# Patient Record
Sex: Female | Born: 1940 | Race: White | Hispanic: No | Smoking: Never smoker
Health system: Southern US, Community
[De-identification: ages and names within clinical notes are randomized; demographics above are authoritative.]

## PROBLEM LIST (undated history)

## (undated) DIAGNOSIS — R0789 Other chest pain: Secondary | ICD-10-CM

## (undated) DIAGNOSIS — Z6831 Body mass index (BMI) 31.0-31.9, adult: Secondary | ICD-10-CM

## (undated) DIAGNOSIS — E785 Hyperlipidemia, unspecified: Secondary | ICD-10-CM

## (undated) DIAGNOSIS — R0602 Shortness of breath: Secondary | ICD-10-CM

## (undated) DIAGNOSIS — C9110 Chronic lymphocytic leukemia of B-cell type not having achieved remission: Secondary | ICD-10-CM

## (undated) DIAGNOSIS — E039 Hypothyroidism, unspecified: Secondary | ICD-10-CM

## (undated) DIAGNOSIS — H9209 Otalgia, unspecified ear: Secondary | ICD-10-CM

## (undated) DIAGNOSIS — I10 Essential (primary) hypertension: Secondary | ICD-10-CM

## (undated) HISTORY — DX: Body mass index (BMI) 31.0-31.9, adult: Z68.31

## (undated) HISTORY — DX: Hyperlipidemia, unspecified: E78.5

## (undated) HISTORY — DX: Other chest pain: R07.89

## (undated) HISTORY — DX: Shortness of breath: R06.02

## (undated) HISTORY — DX: Chronic lymphocytic leukemia of B-cell type not having achieved remission: C91.10

## (undated) HISTORY — DX: Otalgia, unspecified ear: H92.09

## (undated) HISTORY — DX: Hypothyroidism, unspecified: E03.9

## (undated) HISTORY — DX: Essential (primary) hypertension: I10

---

## 2009-06-03 ENCOUNTER — Emergency Department (HOSPITAL_COMMUNITY): Admission: EM | Admit: 2009-06-03 | Discharge: 2009-06-03 | Payer: Self-pay | Admitting: Family Medicine

## 2009-07-26 ENCOUNTER — Emergency Department (HOSPITAL_COMMUNITY): Admission: EM | Admit: 2009-07-26 | Discharge: 2009-07-26 | Payer: Self-pay | Admitting: Family Medicine

## 2009-11-08 ENCOUNTER — Encounter: Payer: Self-pay | Admitting: Obstetrics and Gynecology

## 2009-11-08 ENCOUNTER — Ambulatory Visit (HOSPITAL_COMMUNITY): Admission: RE | Admit: 2009-11-08 | Discharge: 2009-11-09 | Payer: Self-pay | Admitting: Obstetrics and Gynecology

## 2010-10-02 ENCOUNTER — Encounter: Payer: Self-pay | Admitting: Family Medicine

## 2010-12-01 LAB — COMPREHENSIVE METABOLIC PANEL
ALT: 17 U/L (ref 0–35)
AST: 18 U/L (ref 0–37)
Albumin: 3.8 g/dL (ref 3.5–5.2)
Alkaline Phosphatase: 61 U/L (ref 39–117)
BUN: 7 mg/dL (ref 6–23)
CO2: 28 mEq/L (ref 19–32)
Calcium: 9.1 mg/dL (ref 8.4–10.5)
Chloride: 105 mEq/L (ref 96–112)
Creatinine, Ser: 0.64 mg/dL (ref 0.4–1.2)
GFR calc Af Amer: >60 mL/min (ref >60)
GFR calc non Af Amer: >60 mL/min (ref >60)
Glucose, Bld: 102 mg/dL — ABNORMAL HIGH (ref 70–99)
Potassium: 3.8 mEq/L (ref 3.5–5.1)
Sodium: 138 mEq/L (ref 135–145)
Total Bilirubin: 0.4 mg/dL (ref 0.3–1.2)
Total Protein: 6.7 g/dL (ref 6.0–8.3)

## 2010-12-01 LAB — CBC
HCT: 39 % (ref 36.0–46.0)
Platelets: 269 10*3/uL (ref 150–400)
RBC: 4.46 MIL/uL (ref 3.87–5.11)

## 2010-12-05 LAB — CBC
RBC: 3.46 MIL/uL — ABNORMAL LOW (ref 3.87–5.11)
WBC: 10.1 10*3/uL (ref 4.0–10.5)

## 2010-12-14 LAB — POCT I-STAT, CHEM 8
Hemoglobin: 13.6 g/dL (ref 12.0–15.0)
Sodium: 140 mEq/L (ref 135–145)
TCO2: 26 mmol/L (ref 0–100)

## 2010-12-14 LAB — CBC
Hemoglobin: 13.3 g/dL (ref 12.0–15.0)
Platelets: 259 10*3/uL (ref 150–400)
RDW: 13.5 % (ref 11.5–15.5)

## 2010-12-14 LAB — DIFFERENTIAL
Basophils Absolute: 0.1 10*3/uL (ref 0.0–0.1)
Eosinophils Absolute: 0.2 10*3/uL (ref 0.0–0.7)
Eosinophils Relative: 2 % (ref 0–5)

## 2011-05-01 IMAGING — CR DG SINUSES 1-2V
2 series · 2 of 2 positions shown · non-contrast
Comparison: None

CLINICAL DATA: Recurrent/chronic infections of the sinuses and
[DATE].

PARANASAL SINUSES - 1-2 VIEW

[view not recorded (1 of 2)]
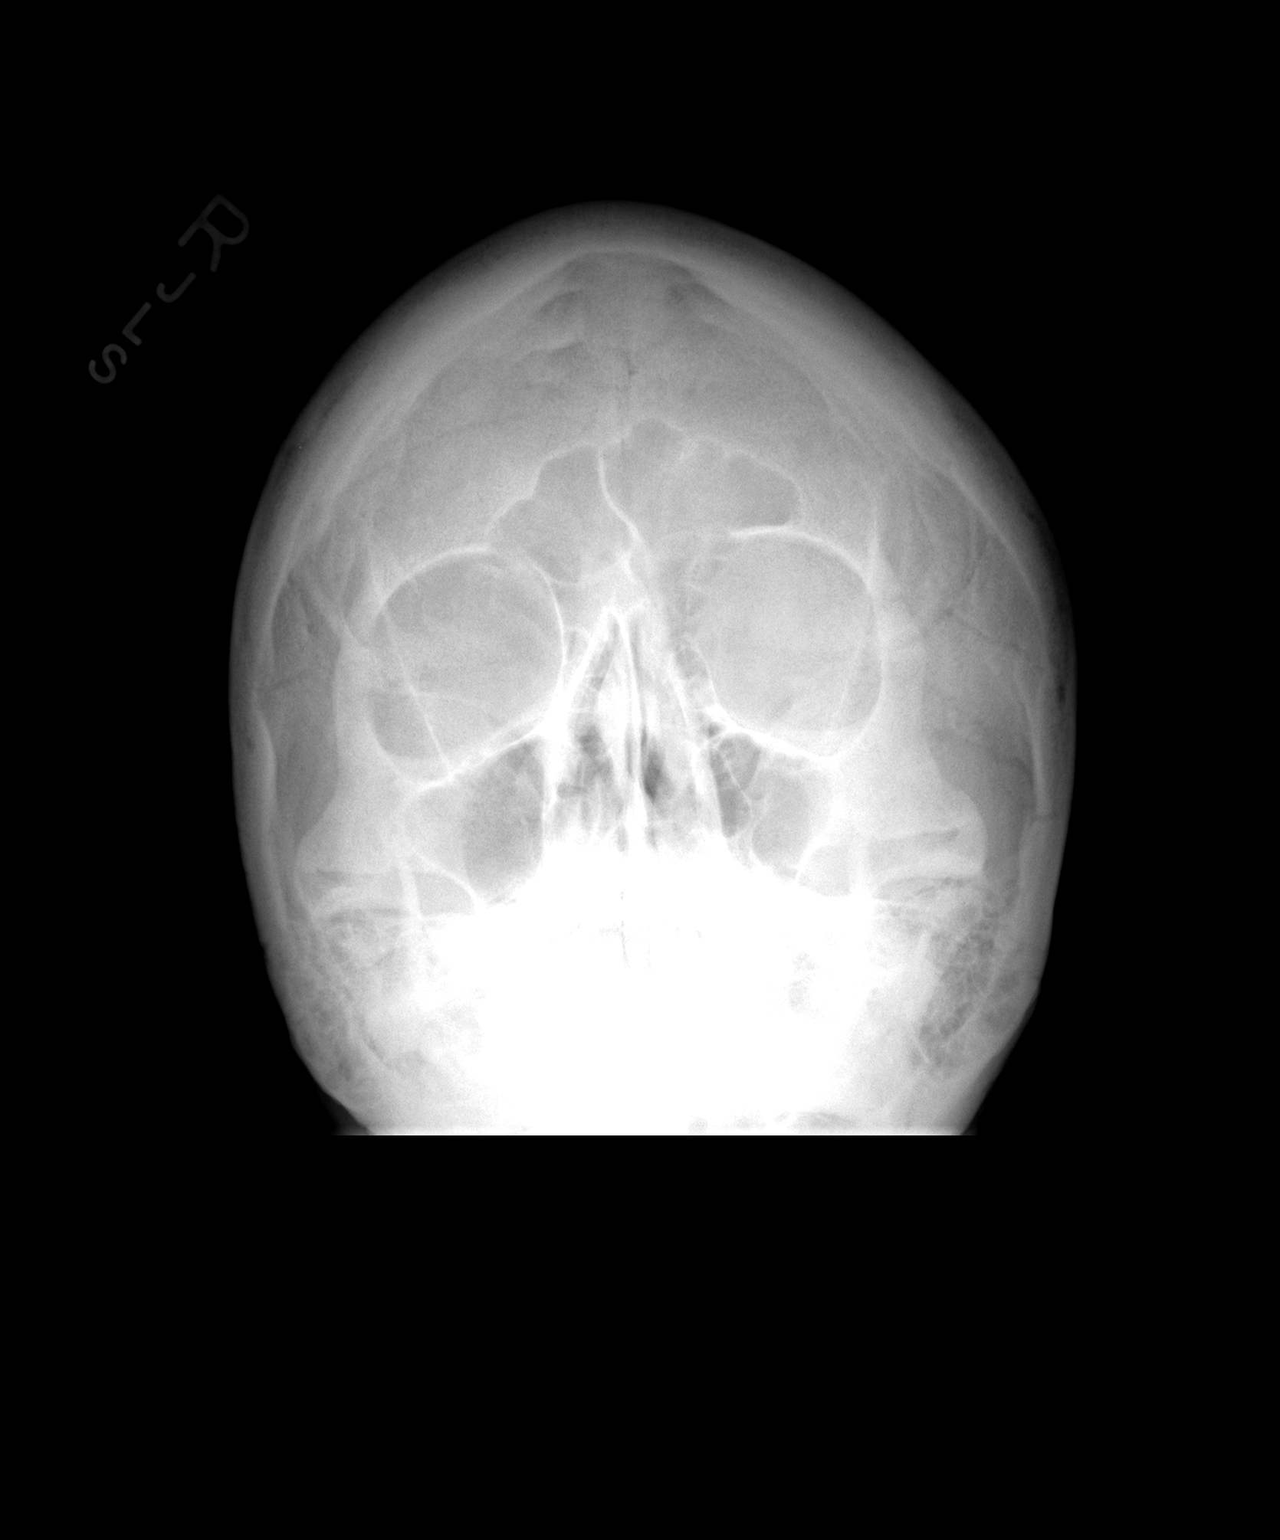

[view not recorded (2 of 2)]
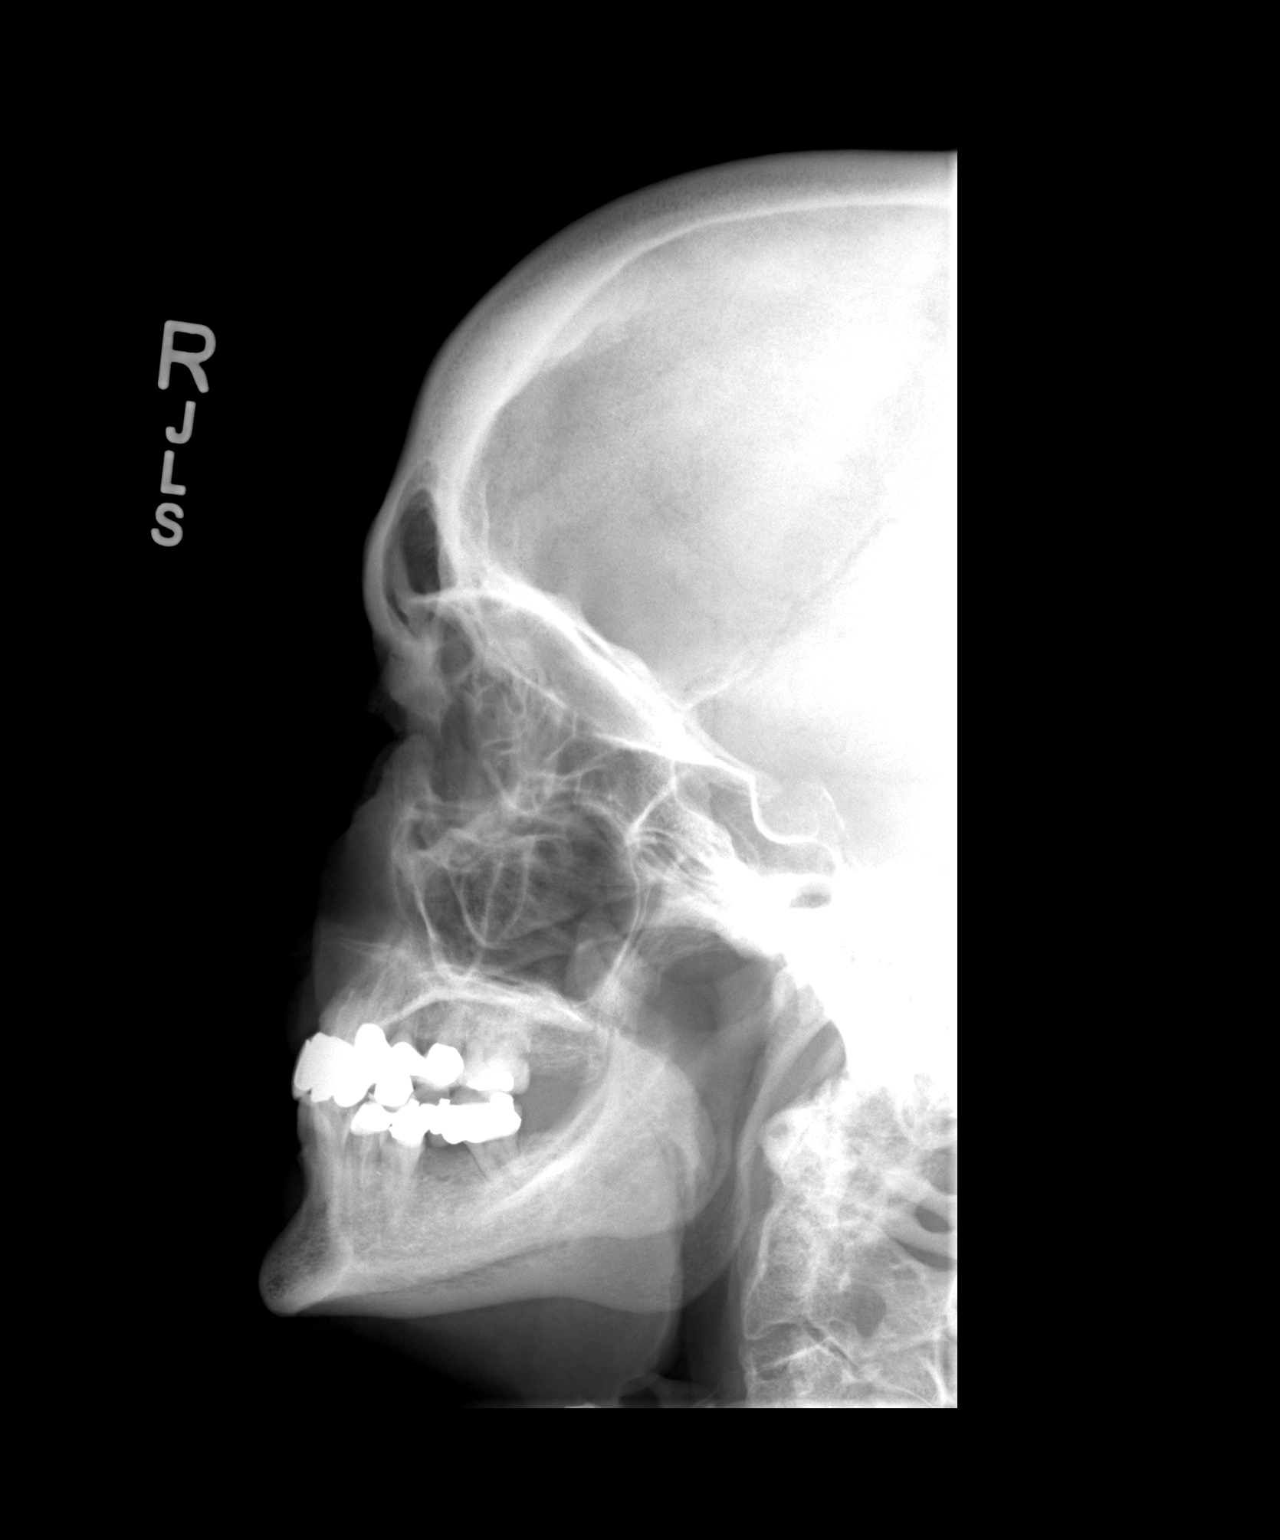

[2 of 2 positions shown; findings below may reference images not displayed]

FINDINGS: A two view study shows no obvious air fluid levels or
significant mucoperiosteal thickening of the paranasal sinuses.  No
osseous destructive changes are noted.
IMPRESSION: No pathological findings.

## 2020-06-25 ENCOUNTER — Other Ambulatory Visit: Payer: Self-pay

## 2021-09-29 DIAGNOSIS — Z20828 Contact with and (suspected) exposure to other viral communicable diseases: Secondary | ICD-10-CM | POA: Diagnosis not present

## 2021-09-29 DIAGNOSIS — J0101 Acute recurrent maxillary sinusitis: Secondary | ICD-10-CM | POA: Diagnosis not present

## 2021-10-18 DIAGNOSIS — E039 Hypothyroidism, unspecified: Secondary | ICD-10-CM | POA: Diagnosis not present

## 2021-10-18 DIAGNOSIS — I10 Essential (primary) hypertension: Secondary | ICD-10-CM | POA: Diagnosis not present

## 2021-10-18 DIAGNOSIS — E7849 Other hyperlipidemia: Secondary | ICD-10-CM | POA: Diagnosis not present

## 2021-10-25 DIAGNOSIS — D72829 Elevated white blood cell count, unspecified: Secondary | ICD-10-CM | POA: Diagnosis not present

## 2021-10-25 DIAGNOSIS — I10 Essential (primary) hypertension: Secondary | ICD-10-CM | POA: Diagnosis not present

## 2021-10-25 DIAGNOSIS — E7849 Other hyperlipidemia: Secondary | ICD-10-CM | POA: Diagnosis not present

## 2021-10-25 DIAGNOSIS — Z0000 Encounter for general adult medical examination without abnormal findings: Secondary | ICD-10-CM | POA: Diagnosis not present

## 2021-10-25 DIAGNOSIS — E039 Hypothyroidism, unspecified: Secondary | ICD-10-CM | POA: Diagnosis not present

## 2021-10-25 DIAGNOSIS — Z6831 Body mass index (BMI) 31.0-31.9, adult: Secondary | ICD-10-CM | POA: Diagnosis not present

## 2021-11-08 NOTE — Progress Notes (Signed)
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 female is here at the request of her PCP (Dr. 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  She is accompanied by her son, Jenny Reichmann.  Labs sent by PCP (10/18/2021) show WBC 18.5 with lymphocytes 12.4 in the presence of atypical lymphocytes and smudge cells.  Normal hemoglobin 12.9 and platelets 307.  CMP unremarkable.  No prior labs available except for CBC in 2011 which was essentially unremarkable.  She denies any new lumps or bumps.   She reports that she lost about 20 pounds when she had COVID in December 2021, but since that time her weight has been stable.  She does report some early satiety and reports that she gets full earlier than she used to.  She denies any abdominal pain. She denies any unexplained fevers or night sweats.  She denies rigors or shaking chills, but reports that she "is cold all the time" from her hypothyroidism. She denies any extreme fatigue and reports that she is able to perform her ADLs. She has a longstanding history of chronic sinusitis with frequent sinus infections for the past several decades.  She reports that this happens to her "every winter" and that she requires antibiotics for sinus infections 2-3 times each year.  She denies any other frequent infections such as UTI, pneumonia, or cellulitis. She  denies symptoms of hyperviscosity such as headache, dizziness, tinnitus, or vision changes. She has occasional scant epistaxis related to her sinusitis, but denies any other signs of active blood loss such as hematochezia, melena, hematuria, or hematemesis. She has  not noticed any new skin lesions. Overall, she does not feel that she has been the same since she had COVID in December 2021, and has had some shortness of breath on exertion and intermittent chest discomfort since that time. She reports 80% energy and 60% appetite.  Patient has strong family history of leukemia.  Her son was diagnosed with MDS in his 44s, which transitioned to AML at age 57.  She had 2 maternal uncles with leukemia of an unknown type. She denies any personal history of cancer.   MEDICAL HISTORY:  Essential hypertension Elevated WBC count Hypothyroidism Obesity (BMI 31.0-31.9)   SOCIAL HISTORY: Social History   Socioeconomic History   Marital status: Divorced    Spouse name: Not on file   Number of children: Not on file   Years of education: Not on file   Highest education level: Not on file  Occupational History   Not on file  Tobacco Use   Smoking status: Not on file   Smokeless tobacco: Not on file  Substance and Sexual Activity   Alcohol use: Not on file   Drug use: Not on file   Sexual activity: Not on file  Other Topics Concern   Not on file  Social History Narrative   Not on file   Social Determinants of Health   Financial Resource Strain: Not on file  Food Insecurity: Not on file  Transportation Needs: Not on file  Physical Activity: Not on file  Stress: Not on file  Social Connections: Not on file  Intimate Partner Violence: Not on file  FAMILY HISTORY: Son with MDS and AML in his 54s Maternal uncles x2 with unspecified leukemia  ALLERGIES:  has no allergies on file.  MEDICATIONS:  Current Outpatient Medications  Medication Sig Dispense Refill   SYNTHROID 25 MCG tablet Take 25 mcg by mouth daily.     No current facility-administered medications for this visit.    REVIEW OF SYSTEMS:   Review of Systems  Constitutional:  Positive for appetite change. Negative for chills, diaphoresis, fatigue, fever and unexpected weight  change.  HENT:   Positive for nosebleeds (Occasional scant epistaxis). Negative for lump/mass.        Chronic sinusitis  Eyes:  Negative for eye problems.  Respiratory:  Positive for cough (From sinus congestion) and shortness of breath (With exertion, ever since COVID). Negative for hemoptysis.   Cardiovascular:  Positive for chest pain (Occasional chest discomfort ever since COVID). Negative for leg swelling and palpitations.  Gastrointestinal:  Positive for vomiting. Negative for abdominal pain, blood in stool, constipation, diarrhea and nausea.  Genitourinary:  Negative for hematuria.   Skin: Negative.   Neurological:  Positive for headaches. Negative for dizziness and light-headedness.  Hematological:  Does not bruise/bleed easily.     PHYSICAL EXAMINATION: ECOG PERFORMANCE STATUS: 1 - Symptomatic but completely ambulatory  Vitals:   11/09/21 0842  BP: 137/81  Pulse: (!) 109  Resp: 18  Temp: (!) 96.8 F (36 C)  SpO2: 98%   Filed Weights   11/09/21 0842  Weight: 158 lb 8 oz (71.9 kg)    Physical Exam Constitutional:      Appearance: Normal appearance. She is obese.  HENT:     Head: Normocephalic and atraumatic.     Mouth/Throat:     Mouth: Mucous membranes are moist.  Eyes:     Extraocular Movements: Extraocular movements intact.     Pupils: Pupils are equal, round, and reactive to light.  Cardiovascular:     Rate and Rhythm: Regular rhythm. Tachycardia present.     Pulses: Normal pulses.     Heart sounds: Normal heart sounds.  Pulmonary:     Effort: Pulmonary effort is normal.     Breath sounds: Normal breath sounds.  Abdominal:     General: Bowel sounds are normal.     Palpations: Abdomen is soft. There is no hepatomegaly or splenomegaly.     Tenderness: There is no abdominal tenderness.  Musculoskeletal:        General: No swelling.     Right lower leg: No edema.     Left lower leg: No edema.  Lymphadenopathy:     Head:     Right side of head: No  submental, submandibular, tonsillar, preauricular, posterior auricular or occipital adenopathy.     Left side of head: No submental, submandibular, tonsillar, preauricular, posterior auricular or occipital adenopathy.     Cervical: No cervical adenopathy.     Right cervical: No superficial, deep or posterior cervical adenopathy.    Left cervical: No superficial, deep or posterior cervical adenopathy.     Upper Body:     Right upper body: No supraclavicular, axillary, pectoral or epitrochlear adenopathy.     Left upper body: No supraclavicular, axillary or pectoral adenopathy.     Lower Body: No right inguinal adenopathy. No left inguinal adenopathy.  Skin:    General: Skin is warm and dry.  Neurological:     General: No focal deficit present.     Mental Status: She is alert and oriented to person, place, and  time.  Psychiatric:        Mood and Affect: Mood normal.        Behavior: Behavior normal.      LABORATORY DATA:  I have reviewed the data as listed No results found for this or any previous visit (from the past 2160 hour(s)).  RADIOGRAPHIC STUDIES: I have personally reviewed the radiological images as listed and agreed with the findings in the report. No results found.  ASSESSMENT & PLAN: 1.  Lymphocyte predominant leukocytosis - Patient seen at the request of her PCP (Dr. 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 - Labs sent by PCP (10/18/2021) show WBC 18.5 with lymphocytes 12.4 in the presence of atypical lymphocytes and smudge cells.  Normal hemoglobin 12.9 and platelets 307.  CMP unremarkable.  No prior labs available except for CBC in 2011 which was essentially unremarkable. - She has longstanding chronic sinusitis requiring antibiotics 2-3 times per year, but denies any other frequent infections. - Mild early satiety since she had COVID in December 2021.  Lost 20 pounds during COVID, but weight has been stable since  that time. - No B symptoms or unexplained weight loss. - No lymphadenopathy or hepatosplenomegaly palpated on exam  - PLAN: High suspicion for CLL versus SLL due to lymphocytosis with abnormal lymphocytes and smudge cells noted on exam. - Repeat CBC with peripheral smear.  Check flow cytometry and LDH. - Pending above results, would possibly consider abdominal imaging to evaluate for any splenomegaly or intra-abdominal lymphadenopathy - RTC in 2 weeks to discuss results.  2.  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  Occasional alcohol (1 beer per month on average) - FAMILY: Son with MDS that transitioned to AML (in his 54s).  Maternal uncles x2 with unspecified leukemia.   PLAN SUMMARY & DISPOSITION: Labs today RTC in 2 weeks to discuss results  All questions were answered. The patient knows to call the clinic with any problems, questions or concerns.   Medical decision making: Moderate  Time spent on visit: I spent 25 minutes counseling the patient face to face. The total time spent in the appointment was 40 minutes and more than 50% was on counseling.  I, Tarri Abernethy PA-C, have seen this patient in conjunction with Dr. Derek Jack.  Greater than 50% of visit was performed by Dr. Delton Coombes.   Harriett Rush, PA-C 11/09/2021 10:51 AM  DR. Magaly Pollina: I have independently evaluated this patient and formulated my assessment and plan.  I agree with HPI, assessment and plan written by Casey Burkitt, PA-C.  Patient evaluated for elevated white count, predominantly lymphocytosis.  She does not have any B symptoms or other cytopenias based on labs sent by Dr. Pleas Koch.  No palpable adenopathy or splenomegaly.  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  We will repeat her CBC today and order flow cytometry.  We will discuss with her  after the results are back.

## 2021-11-09 ENCOUNTER — Inpatient Hospital Stay (HOSPITAL_COMMUNITY): Payer: No Typology Code available for payment source | Attending: Hematology | Admitting: Hematology

## 2021-11-09 ENCOUNTER — Other Ambulatory Visit: Payer: Self-pay

## 2021-11-09 ENCOUNTER — Inpatient Hospital Stay (HOSPITAL_COMMUNITY): Payer: No Typology Code available for payment source

## 2021-11-09 DIAGNOSIS — E039 Hypothyroidism, unspecified: Secondary | ICD-10-CM | POA: Diagnosis not present

## 2021-11-09 DIAGNOSIS — C9110 Chronic lymphocytic leukemia of B-cell type not having achieved remission: Secondary | ICD-10-CM | POA: Insufficient documentation

## 2021-11-09 DIAGNOSIS — D72820 Lymphocytosis (symptomatic): Secondary | ICD-10-CM

## 2021-11-09 DIAGNOSIS — D72829 Elevated white blood cell count, unspecified: Secondary | ICD-10-CM | POA: Insufficient documentation

## 2021-11-09 DIAGNOSIS — I10 Essential (primary) hypertension: Secondary | ICD-10-CM | POA: Diagnosis not present

## 2021-11-09 DIAGNOSIS — Z79899 Other long term (current) drug therapy: Secondary | ICD-10-CM | POA: Insufficient documentation

## 2021-11-09 LAB — CBC WITH DIFFERENTIAL/PLATELET
Basophils Absolute: 0 10*3/uL (ref 0.0–0.1)
Basophils Relative: 0 %
Eosinophils Absolute: 0.7 10*3/uL — ABNORMAL HIGH (ref 0.0–0.5)
Eosinophils Relative: 4 %
HCT: 43.3 % (ref 36.0–46.0)
Hemoglobin: 14.1 g/dL (ref 12.0–15.0)
Lymphocytes Relative: 48 %
Lymphs Abs: 7.9 10*3/uL — ABNORMAL HIGH (ref 0.7–4.0)
MCH: 29 pg (ref 26.0–34.0)
MCHC: 32.6 g/dL (ref 30.0–36.0)
MCV: 88.9 fL (ref 80.0–100.0)
Monocytes Absolute: 1.5 10*3/uL — ABNORMAL HIGH (ref 0.1–1.0)
Monocytes Relative: 9 %
Neutro Abs: 6.4 10*3/uL (ref 1.7–7.7)
Neutrophils Relative %: 39 %
Platelets: 234 10*3/uL (ref 150–400)
RBC: 4.87 MIL/uL (ref 3.87–5.11)
RDW: 14.3 % (ref 11.5–15.5)
WBC: 16.5 10*3/uL — ABNORMAL HIGH (ref 4.0–10.5)
nRBC: 0 % (ref 0.0–0.2)

## 2021-11-09 LAB — COMPREHENSIVE METABOLIC PANEL
ALT: 12 U/L (ref 0–44)
AST: 19 U/L (ref 15–41)
Albumin: 4.1 g/dL (ref 3.5–5.0)
Alkaline Phosphatase: 57 U/L (ref 38–126)
Anion gap: 11 (ref 5–15)
BUN: 12 mg/dL (ref 8–23)
CO2: 23 mmol/L (ref 22–32)
Calcium: 9.2 mg/dL (ref 8.9–10.3)
Chloride: 101 mmol/L (ref 98–111)
Creatinine, Ser: 0.86 mg/dL (ref 0.44–1.00)
GFR, Estimated: >60 mL/min (ref >60)
Glucose, Bld: 107 mg/dL — ABNORMAL HIGH (ref 70–99)
Potassium: 3.7 mmol/L (ref 3.5–5.1)
Sodium: 135 mmol/L (ref 135–145)
Total Bilirubin: 0.4 mg/dL (ref 0.3–1.2)
Total Protein: 7.7 g/dL (ref 6.5–8.1)

## 2021-11-09 LAB — LACTATE DEHYDROGENASE: LDH: 143 U/L (ref 98–192)

## 2021-11-09 NOTE — Patient Instructions (Signed)
[place] at [facility] ?Discharge Instructions ? ?You were seen today by Dr. Delton Coombes & Tarri Abernethy PA-C for your elevated white blood cells.  As we discussed, this may be a type of slow-growing blood cancer known as chronic lymphocytic leukemia (CLL).  We will check blood test today to help [place] determine your official diagnosis.  We will see you back in 2 to 3 weeks to discuss these results and next steps. ? ? ?Thank you for choosing [place] at [facility] to provide your oncology and hematology care.  To afford each patient quality time with our provider, please arrive at least 15 minutes before your scheduled appointment time.  ? ?If you have a lab appointment with the [facility] please come in thru the Main Entrance and check in at the main information desk. ? ?You need to re-schedule your appointment should you arrive 10 or more minutes late.  We strive to give you quality time with our providers, and arriving late affects you and other patients whose appointments are after yours.  Also, if you no show three or more times for appointments you may be dismissed from the clinic at the providers discretion.     ?Again, thank you for choosing [facility].  Our hope is that these requests will decrease the amount of time that you wait before being seen by our physicians.       ?_____________________________________________________________ ? ?Should you have questions after your visit to [facility], please contact our office at [phone] and follow the prompts.  Our office hours are 8:00 a.m. and 4:30 p.m. Monday - Friday.  Please note that voicemails left after 4:00 p.m. may not be returned until the following business day.  We are closed weekends and major holidays.  You do have access to a nurse 24-7, just call the main number to the clinic [phone] and do not press any options, hold on the line and a nurse will  answer the phone.   ? ?For prescription refill requests, have your pharmacy contact our office and allow 72 hours.   ? ?Due to Covid, you will need to wear a mask upon entering the hospital. If you do not have a mask, a mask will be given to you at the Main Entrance upon arrival. For doctor visits, patients may have 1 support person age 80 or older with them. For treatment visits, patients can not have anyone with them due to social distancing guidelines and our immunocompromised population.  ? ? ? ?

## 2021-11-11 LAB — SURGICAL PATHOLOGY

## 2021-11-11 LAB — FLOW CYTOMETRY

## 2021-11-14 LAB — PATHOLOGIST SMEAR REVIEW

## 2021-11-30 ENCOUNTER — Other Ambulatory Visit (HOSPITAL_COMMUNITY): Payer: Self-pay | Admitting: *Deleted

## 2021-11-30 ENCOUNTER — Inpatient Hospital Stay (HOSPITAL_BASED_OUTPATIENT_CLINIC_OR_DEPARTMENT_OTHER): Payer: No Typology Code available for payment source | Admitting: Hematology

## 2021-11-30 ENCOUNTER — Other Ambulatory Visit: Payer: Self-pay

## 2021-11-30 ENCOUNTER — Ambulatory Visit (HOSPITAL_COMMUNITY): Payer: No Typology Code available for payment source | Admitting: Hematology

## 2021-11-30 VITALS — BP 160/91 | HR 79 | Temp 97.5°F | Resp 18 | Ht 60.0 in | Wt 158.6 lb

## 2021-11-30 DIAGNOSIS — C9110 Chronic lymphocytic leukemia of B-cell type not having achieved remission: Secondary | ICD-10-CM | POA: Diagnosis not present

## 2021-11-30 DIAGNOSIS — D72820 Lymphocytosis (symptomatic): Secondary | ICD-10-CM

## 2021-11-30 NOTE — Patient Instructions (Signed)
[place] at [facility] ?Discharge Instructions ? ? ?You were seen and examined today by Dr. Delton Coombes. ? ?He reviewed the results of the flow cytometry.  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 This is a type of leukemia that we can monitor without any form of treatment.  We will only initiate treatment if this condition starts affecting your blood counts. ? ?We will see you back in 4 months with repeat blood work.  ? ? ?Thank you for choosing [place] at [facility] to provide your oncology and hematology care.  To afford each patient quality time with our provider, please arrive at least 15 minutes before your scheduled appointment time.  ? ?If you have a lab appointment with the [facility] please come in thru the Main Entrance and check in at the main information desk. ? ?You need to re-schedule your appointment should you arrive 10 or more minutes late.  We strive to give you quality time with our providers, and arriving late affects you and other patients whose appointments are after yours.  Also, if you no show three or more times for appointments you may be dismissed from the clinic at the providers discretion.     ?Again, thank you for choosing [facility].  Our hope is that these requests will decrease the amount of time that you wait before being seen by our physicians.       ?_____________________________________________________________ ? ?Should you have questions after your visit to [facility], please contact our office at [phone] and follow the prompts.  Our office hours are 8:00 a.m. and 4:30 p.m. Monday - Friday.  Please note that voicemails left after 4:00 p.m. may not be returned until the following business day.  We are closed weekends and major holidays.  You do have access to a nurse 24-7, just call the main number to the clinic [phone] and do not  press any options, hold on the line and a nurse will answer the phone.   ? ?For prescription refill requests, have your pharmacy contact our office and allow 72 hours.   ? ?Due to Covid, you will need to wear a mask upon entering the hospital. If you do not have a mask, a mask will be given to you at the Main Entrance upon arrival. For doctor visits, patients may have 1 support person age 50 or older with them. For treatment visits, patients can not have anyone with them due to social distancing guidelines and our immunocompromised population.  ? ?   ?

## 2021-11-30 NOTE — Progress Notes (Signed)
? ?[facility] ?[address] ?[place], [place] [address] ? ? ?CLINIC:  ?Medical Oncology/Hematology ? ?PCP:  ?Curlene Labrum, MD ?[address] [place] [address]  ?[phone] ? ?REASON FOR VISIT:  ?Follow-up for lymphocyte predominant leukocytosis ? ?PRIOR THERAPY: none ? ?CURRENT THERAPY: under work-up ? ?INTERVAL HISTORY:  ?[name], a 81 y.o. female, returns for routine follow-up for her lymphocyte predominant leukocytosis. Tracy Curtis was last seen on 11/09/2021. ? ?Today she reports feeling well. Since her last visit she had a Covid infection from which she reports a residual cough.  ? ?REVIEW OF SYSTEMS:  ?Review of Systems  ?Constitutional:  Negative for appetite change and fatigue.  ?Respiratory:  Positive for cough and shortness of breath.   ?Neurological:  Positive for headaches.  ?All other systems reviewed and are negative. ? ?PAST MEDICAL/SURGICAL HISTORY:  ?No past medical history on file. ? ? ?SOCIAL HISTORY:  ?Social History  ? ?Socioeconomic History  ? Marital status: Divorced  ?  Spouse name: Not on file  ? Number of children: Not on file  ? Years of education: Not on file  ? Highest education level: Not on file  ?Occupational History  ? Not on file  ?Tobacco Use  ? Smoking status: Not on file  ? Smokeless tobacco: Not on file  ?Substance and Sexual Activity  ? Alcohol use: Not on file  ? Drug use: Not on file  ? Sexual activity: Not on file  ?Other Topics Concern  ? Not on file  ?Social History Narrative  ? Not on file  ? ?Social Determinants of Health  ? ?Financial Resource Strain: Not on file  ?Food Insecurity: Not on file  ?Transportation Needs: Not on file  ?Physical Activity: Not on file  ?Stress: Not on file  ?Social Connections: Not on file  ?Intimate Partner Violence: Not on file  ? ? ?FAMILY HISTORY:  ?No family history on file. ? ?CURRENT MEDICATIONS:  ?Current Outpatient Medications  ?Medication Sig Dispense Refill  ? SYNTHROID 25 MCG tablet Take 25 mcg by mouth daily.     ? ?No current facility-administered medications for this visit.  ? ? ?ALLERGIES:  ?Not on File ? ?PHYSICAL EXAM:  ?Performance status (ECOG): 1 - Symptomatic but completely ambulatory ? ?There were no vitals filed for this visit. ?Wt Readings from Last 3 Encounters:  ?11/09/21 158 lb 8 oz (71.9 kg)  ? ?Physical Exam ?Vitals reviewed.  ?Constitutional:   ?   Appearance: Normal appearance.  ?Cardiovascular:  ?   Rate and Rhythm: Normal rate and regular rhythm.  ?   Pulses: Normal pulses.  ?   Heart sounds: Normal heart sounds.  ?Pulmonary:  ?   Effort: Pulmonary effort is normal.  ?   Breath sounds: Normal breath sounds.  ?Neurological:  ?   General: No focal deficit present.  ?   Mental Status: She is alert and oriented to person, place, and time.  ?Psychiatric:     ?   Mood and Affect: Mood normal.     ?   Behavior: Behavior normal.  ? ? ?LABORATORY DATA:  ?I have reviewed the labs as listed.  ? ?  Latest Ref Rng & Units 11/09/2021  ? 10:22 AM 11/09/2009  ?  4:55 AM 11/03/2009  ? 12:00 PM  ?CBC  ?WBC 4.0 - 10.5 K/uL 16.5   10.1   7.3    ?Hemoglobin 12.0 - 15.0 g/dL 14.1   10.2   13.0    ?  Hematocrit 36.0 - 46.0 % 43.3   30.6   39.0    ?Platelets 150 - 400 K/uL 234   189   269    ? ? ?  Latest Ref Rng & Units 11/09/2021  ? 10:22 AM 11/03/2009  ? 12:00 PM 07/26/2009  ?  3:53 PM  ?CMP  ?Glucose 70 - 99 mg/dL 107   102   90    ?BUN 8 - 23 mg/dL 12   7   9     ?Creatinine 0.44 - 1.00 mg/dL 0.86   0.64   0.7    ?Sodium 135 - 145 mmol/L 135   138   140    ?Potassium 3.5 - 5.1 mmol/L 3.7   3.8   3.9    ?Chloride 98 - 111 mmol/L 101   105   103    ?CO2 22 - 32 mmol/L 23   28     ?Calcium 8.9 - 10.3 mg/dL 9.2   9.1     ?Total Protein 6.5 - 8.1 g/dL 7.7   6.7     ?Total Bilirubin 0.3 - 1.2 mg/dL 0.4   0.4     ?Alkaline Phos 38 - 126 U/L 57   61     ?AST 15 - 41 U/L 19   18     ?ALT 0 - 44 U/L 12   17     ? ?   ?Component Value Date/Time  ? RBC 4.87 11/09/2021 1022  ? MCV 88.9 11/09/2021 1022  ? MCH 29.0 11/09/2021 1022  ? MCHC 32.6  11/09/2021 1022  ? RDW 14.3 11/09/2021 1022  ? LYMPHSABS 7.9 (H) 11/09/2021 1022  ? MONOABS 1.5 (H) 11/09/2021 1022  ? EOSABS 0.7 (H) 11/09/2021 1022  ? BASOSABS 0.0 11/09/2021 1022  ? ? ?DIAGNOSTIC IMAGING:  ?I have independently reviewed the scans and discussed with the patient. ?No results found.  ? ?ASSESSMENT:  ?1.  Lymphocyte predominant leukocytosis ?- Patient seen at the request of her PCP (Dr. 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 ?- Labs sent by PCP (10/18/2021) show WBC 18.5 with lymphocytes 12.4 in the presence of atypical lymphocytes and smudge cells.  Normal hemoglobin 12.9 and platelets 307.  CMP unremarkable.  No prior labs available except for CBC in 2011 which was essentially unremarkable. ?- She has longstanding chronic sinusitis requiring antibiotics 2-3 times per year, but denies any other frequent infections. ?- Mild early satiety since she had COVID in December 2021.  Lost 20 pounds during COVID, but weight has been stable since that time. ?- No B symptoms or unexplained weight loss. ?- No lymphadenopathy or hepatosplenomegaly palpated on exam  ? ?  ?2.  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  Occasional alcohol (1 beer per month on average) ?- FAMILY: Son with MDS that transitioned to AML (in his 32s).  Maternal uncles x2 with unspecified leukemia. ? ? ?PLAN:  ?1.  Stage 0 CLL: ?- We have reviewed flow cytometry results from 11/09/2021 which showed monoclonal B-cell population, positive for CD5, CD20, CD19, negative for CD10. ?- CBC on 11/09/2021 showed white count 16.5 with normal hemoglobin and platelet count.  Differential showed elevated lymphocytes, monocytes and eosinophils but predominantly lymphocytes.  LDH was normal. ?- She does not have any B symptoms or recurrent infections.  No palpable lymphadenopathy or  hepatosplenomegaly. ?- We discussed normal pathophysiology of chronic  lymphocytic leukemia.  As she is asymptomatic, recommend close observation. ?- We discussed treatment indications which include B symptoms like fevers, night sweats and weight loss of more than 10% of body weight (unintentional), anemia, thrombocytopenia, neutropenia, painful splenomegaly, painful lymphadenopathy and recurrent infections.  We will do further testing in the form of CLL FISH panel, I GVH mutation, T p53 mutation if there is any indication for treatment. ?- We will see her back in 4 months for follow-up.  If she is stable, we will switch her to every 6 months visits. ? ? ?Orders placed this encounter:  ?No orders of the defined types were placed in this encounter. ? ? ? ?Derek Jack, MD ?[place] ?[phone] ? ? ?I, Thana Ates, am acting as a scribe for Dr. Derek Jack. ? ?I, Derek Jack MD, have reviewed the above documentation for accuracy and completeness, and I agree with the above. ?  ? ? ?

## 2021-12-23 DIAGNOSIS — I10 Essential (primary) hypertension: Secondary | ICD-10-CM | POA: Diagnosis not present

## 2021-12-23 DIAGNOSIS — C9110 Chronic lymphocytic leukemia of B-cell type not having achieved remission: Secondary | ICD-10-CM | POA: Diagnosis not present

## 2021-12-23 DIAGNOSIS — E039 Hypothyroidism, unspecified: Secondary | ICD-10-CM | POA: Diagnosis not present

## 2021-12-23 DIAGNOSIS — E7849 Other hyperlipidemia: Secondary | ICD-10-CM | POA: Diagnosis not present

## 2021-12-23 DIAGNOSIS — Z6830 Body mass index (BMI) 30.0-30.9, adult: Secondary | ICD-10-CM | POA: Diagnosis not present

## 2022-04-01 NOTE — Progress Notes (Unsigned)
[place], [place] [address]   CLINIC:  Medical Oncology/Hematology  PCP:  Curlene Labrum, MD [place] [id number] [phone]   REASON FOR VISIT:  Follow-up for stage 0 CLL  PRIOR THERAPY: None  CURRENT THERAPY: Surveillance  INTERVAL HISTORY:  [name] 81 y.o. female returns for routine follow-up of her stage 0 CLL.  Her last visit was with Dr. Delton Coombes on 11/30/2021.  At today's visit, she reports feeling fair.  No recent hospitalizations, surgeries, or changes in baseline health status.  *** covid and flu in march  She denies any new lumps or bumps.  She  has not had any fever, chills, night sweats, or unintentional weight loss.  She  has not had any extreme fatigue.  She  denies symptoms of hyperviscosity such as headache, dizziness, tinnitus, or vision changes.  No early satiety or abdominal pain.   She  denies any signs or symptoms of blood loss.  ***ONCE EVERY 1-2 MONTHS, BLEEDING FROM LEFT NOSTRIL, LASTS 1-2 MINUTES *** No chest pain, shortness of breath, syncope, or palpitations.  ***     She has ***% energy and ***% appetite. She endorses that she is maintaining a stable weight.   REVIEW OF SYSTEMS:  ***  Review of Systems - Oncology    PAST MEDICAL/SURGICAL HISTORY:  No past medical history on file. *** The histories are not reviewed yet. Please review them in the "History" navigator section and refresh this Dix.   SOCIAL HISTORY:  Social History   Socioeconomic History   Marital status: Divorced    Spouse name: Not on file   Number of children: Not on file   Years of education: Not on file   Highest education level: Not on file  Occupational History   Not on file  Tobacco Use   Smoking status: Not on file   Smokeless tobacco: Not on file  Substance and Sexual Activity   Alcohol use: Not on file   Drug use: Not on file   Sexual activity: Not on file  Other Topics Concern   Not on file   Social History Narrative   Not on file   Social Determinants of Health   Financial Resource Strain: Not on file  Food Insecurity: Not on file  Transportation Needs: Not on file  Physical Activity: Not on file  Stress: Not on file  Social Connections: Not on file  Intimate Partner Violence: Not on file    FAMILY HISTORY:  No family history on file.  CURRENT MEDICATIONS:  Outpatient Encounter Medications as of 04/03/2022  Medication Sig   SYNTHROID 25 MCG tablet Take 25 mcg by mouth daily.   No facility-administered encounter medications on file as of 04/03/2022.    ALLERGIES:  Allergies  Allergen Reactions   Augmentin [Amoxicillin-Pot Clavulanate] Nausea And Vomiting     PHYSICAL EXAM:  ***  ECOG PERFORMANCE STATUS: {CHL ONC ECOG PS:[id number]}  There were no vitals filed for this visit. There were no vitals filed for this visit. Physical Exam   LABORATORY DATA:  I have reviewed the labs as listed.  CBC    Component Value Date/Time   WBC 16.5 (H) 11/09/2021 1022   RBC 4.87 11/09/2021 1022   HGB 14.1 11/09/2021 1022   HCT 43.3 11/09/2021 1022   PLT 234 11/09/2021 1022   MCV 88.9 11/09/2021 1022   MCH 29.0 11/09/2021 1022   MCHC 32.6 11/09/2021 1022   RDW  14.3 11/09/2021 1022   LYMPHSABS 7.9 (H) 11/09/2021 1022   MONOABS 1.5 (H) 11/09/2021 1022   EOSABS 0.7 (H) 11/09/2021 1022   BASOSABS 0.0 11/09/2021 1022      Latest Ref Rng & Units 11/09/2021   10:22 AM 11/03/2009   12:00 PM 07/26/2009    3:53 PM  CMP  Glucose 70 - 99 mg/dL 107  102  90   BUN 8 - 23 mg/dL 12  7  9    Creatinine 0.44 - 1.00 mg/dL 0.86  0.64  0.7   Sodium 135 - 145 mmol/L 135  138  140   Potassium 3.5 - 5.1 mmol/L 3.7  3.8  3.9   Chloride 98 - 111 mmol/L 101  105  103   CO2 22 - 32 mmol/L 23  28    Calcium 8.9 - 10.3 mg/dL 9.2  9.1    Total Protein 6.5 - 8.1 g/dL 7.7  6.7    Total Bilirubin 0.3 - 1.2 mg/dL 0.4  0.4    Alkaline Phos 38 - 126 U/L 57  61    AST 15 - 41 U/L 19  18     ALT 0 - 44 U/L 12  17      DIAGNOSTIC IMAGING:  I have independently reviewed the relevant imaging and discussed with the patient.  ASSESSMENT & PLAN: 1.  Stage 0 chronic lymphocytic leukemia - Patient seen at the request of her PCP (Dr. 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 - Flow cytometry results from 11/09/2021 which showed monoclonal B-cell population, positive for CD5, CD20, CD19, negative for CD10. - She has longstanding chronic sinusitis requiring antibiotics 2-3 times per year, but denies any other frequent infections. ***  - Mild early satiety since she had COVID in December 2021.  Lost 20 pounds during COVID, but weight has been stable since that time. ***  - No B symptoms or unexplained weight loss. ***  - No lymphadenopathy or hepatosplenomegaly palpated on exam  ***  - CBC (10/18/2021 via PCP) with WBC 18.5 and ALC 12.4 - CBC (11/09/2021): WBC 16.5, lymphocytes 7.9, monocytes and eosinophils slightly elevated.  Normal Hgb and PLT.  LDH normal. - CBC (04/03/2022): WBC 15.4 with ALC 11.1.  LDH normal. - We have discussed normal pathophysiology of chronic lymphocytic leukemia.  As she is asymptomatic, recommend close observation. - We have discussed treatment indications which include B symptoms like fevers, night sweats and weight loss of more than 10% of body weight (unintentional), anemia, thrombocytopenia, neutropenia, painful splenomegaly, painful lymphadenopathy and recurrent infections.  We will do further testing in the form of CLL FISH panel, I GVH mutation, T p53 mutation if there is any indication for treatment. - PLAN: Since she is overall stable, we will switch to every 23-monthfollow-up.  ***  2.  Other history - PMH: Hypertension, hypothyroidism, obesity - SOCIAL: Retired from [employer] several hospitals. No tobacco or drugs.  Occasional alcohol (1 beer per month on average) - FAMILY: Son with MDS that  transitioned to AML (in his 58s.  Maternal uncles x2 with unspecified leukemia.   PLAN SUMMARY & DISPOSITION: ***  All questions were answered. The patient knows to call the clinic with any problems, questions or concerns.  Medical decision making: ***  Time spent on visit: I spent {CHL ONC TIME VISIT - SSNKNL:[id number]}counseling the patient face to face. The total time spent in the appointment was {CHL ONC TIME VISIT - STKWIO:[id number]}  and more than 50% was on counseling.   Harriett Rush, PA-C  ***

## 2022-04-03 ENCOUNTER — Inpatient Hospital Stay (HOSPITAL_BASED_OUTPATIENT_CLINIC_OR_DEPARTMENT_OTHER): Payer: No Typology Code available for payment source | Admitting: Physician Assistant

## 2022-04-03 ENCOUNTER — Inpatient Hospital Stay (HOSPITAL_COMMUNITY): Payer: No Typology Code available for payment source | Attending: Hematology

## 2022-04-03 VITALS — BP 209/92 | HR 79 | Temp 98.8°F | Resp 18 | Ht 60.0 in | Wt 158.4 lb

## 2022-04-03 DIAGNOSIS — C9110 Chronic lymphocytic leukemia of B-cell type not having achieved remission: Secondary | ICD-10-CM | POA: Insufficient documentation

## 2022-04-03 DIAGNOSIS — E039 Hypothyroidism, unspecified: Secondary | ICD-10-CM | POA: Diagnosis not present

## 2022-04-03 DIAGNOSIS — I10 Essential (primary) hypertension: Secondary | ICD-10-CM | POA: Insufficient documentation

## 2022-04-03 DIAGNOSIS — E669 Obesity, unspecified: Secondary | ICD-10-CM | POA: Insufficient documentation

## 2022-04-03 LAB — CBC WITH DIFFERENTIAL/PLATELET
Abs Immature Granulocytes: 0.02 10*3/uL (ref 0.00–0.07)
Basophils Absolute: 0.1 10*3/uL (ref 0.0–0.1)
Basophils Relative: 1 %
Eosinophils Absolute: 0.1 10*3/uL (ref 0.0–0.5)
Eosinophils Relative: 1 %
HCT: 38.9 % (ref 36.0–46.0)
Hemoglobin: 12.7 g/dL (ref 12.0–15.0)
Immature Granulocytes: 0 %
Lymphocytes Relative: 71 %
Lymphs Abs: 11.1 10*3/uL — ABNORMAL HIGH (ref 0.7–4.0)
MCH: 29.5 pg (ref 26.0–34.0)
MCHC: 32.6 g/dL (ref 30.0–36.0)
MCV: 90.3 fL (ref 80.0–100.0)
Monocytes Absolute: 1.2 10*3/uL — ABNORMAL HIGH (ref 0.1–1.0)
Monocytes Relative: 8 %
Neutro Abs: 3 10*3/uL (ref 1.7–7.7)
Neutrophils Relative %: 19 %
Platelets: 247 10*3/uL (ref 150–400)
RBC: 4.31 MIL/uL (ref 3.87–5.11)
RDW: 13.7 % (ref 11.5–15.5)
WBC: 15.4 10*3/uL — ABNORMAL HIGH (ref 4.0–10.5)
nRBC: 0 % (ref 0.0–0.2)

## 2022-04-03 LAB — LACTATE DEHYDROGENASE: LDH: 135 U/L (ref 98–192)

## 2022-04-03 NOTE — Patient Instructions (Signed)
[place] at [facility] Discharge Instructions   You were seen and examined today by Tarri Abernethy PA-C for your chronic lymphocytic leukemia (CLL).  Your blood counts are stable and you are not having any "red flag" symptoms at this time.  We will check your labs and see you for follow-up visit again in 6 months.  If you have any unexplained fever/chills, unintentional weight loss, or new lumps or bumps, please call our office so that we can schedule you for earlier follow-up.  You do not need any treatment for your CLL at this time.  We will continue to monitor you closely.  We will see you back in 6 months with repeat blood work.  **BLOOD PRESSURE: Your blood pressure today was high (187/67).  This is most likely from anxiety related to your visit today.  When you get home today, please rest for 30 to 60 minutes and then recheck your blood pressure.  If it is still higher than 180/100, please call your primary care doctor for further instructions.  If it is higher than 180/100 AND you are having any symptoms of headache, nausea/vomiting, difficulty breathing, chest pain, or symptoms of stroke (slurred speech, facial droop, weakness on one side of your body), then you should go to the [facility].   - - - - - - - - - - - - - - - - - -    Thank you for choosing [place] at [facility] to provide your oncology and hematology care.  To afford each patient quality time with our provider, please arrive at least 15 minutes before your scheduled appointment time.   If you have a lab appointment with the [facility] please come in thru the Main Entrance and check in at the main information desk.  You need to re-schedule your appointment should you arrive 10 or more minutes late.  We strive to give you quality time with our providers, and arriving late affects you and other patients whose appointments are after yours.  Also, if you  no show three or more times for appointments you may be dismissed from the clinic at the providers discretion.     Again, thank you for choosing [facility].  Our hope is that these requests will decrease the amount of time that you wait before being seen by our physicians.       _____________________________________________________________  Should you have questions after your visit to [facility], please contact our office at [phone] and follow the prompts.  Our office hours are 8:00 a.m. and 4:30 p.m. Monday - Friday.  Please note that voicemails left after 4:00 p.m. may not be returned until the following business day.  We are closed weekends and major holidays.  You do have access to a nurse 24-7, just call the main number to the clinic [phone] and do not press any options, hold on the line and a nurse will answer the phone.    For prescription refill requests, have your pharmacy contact our office and allow 72 hours.    Due to Covid, you will need to wear a mask upon entering the hospital. If you do not have a mask, a mask will be given to you at the Main Entrance upon arrival. For doctor visits, patients may have 1 support person age 67 or older with them. For treatment visits, patients can not have anyone with them due to social  distancing guidelines and our immunocompromised population.

## 2022-04-03 NOTE — Progress Notes (Unsigned)
[place], [place] [address]   CLINIC:  Medical Oncology/Hematology  PCP:  Tracy Labrum, MD [place] [phone]   REASON FOR VISIT:  Follow-up for stage 0 CLL  PRIOR THERAPY: None  CURRENT THERAPY: Surveillance  INTERVAL HISTORY:  Tracy Curtis 81 y.o. female returns for routine follow-up of her stage 0 CLL.  Her last visit was with Dr. Delton Curtis on 11/30/2021.  At today's visit, she reports feeling fair.  No recent hospitalizations, surgeries, or changes in baseline health status.  She reports that she had COVID-19 as well as influenza in March 2023, but denies any other recent infections.  She denies any new lumps or bumps.  She  has not had any unexplained fever, chills, night sweats, or unintentional weight loss.  She  has not had any extreme fatigue.  She  denies symptoms of hyperviscosity such as headache, dizziness, tinnitus, or vision changes.  No early satiety or abdominal pain.  She has occasional epistaxis from her left nostril, occurring about once every 2 months and lasting for 1 to 2 minutes.  No other symptoms of blood loss.  No chest pain, shortness of breath, syncope, or palpitations.     She has 90% energy and 100% appetite. She lost about 5 pounds during her COVID infection, but otherwise is maintaining a stable weight.Marland Kitchen   REVIEW OF SYSTEMS:    Review of Systems  Constitutional:  Negative for appetite change, chills, diaphoresis, fatigue, fever and unexpected weight change.  HENT:   Positive for nosebleeds. Negative for lump/mass.   Eyes:  Negative for eye problems.  Respiratory:  Negative for cough, hemoptysis and shortness of breath.   Cardiovascular:  Negative for chest pain, leg swelling and palpitations.  Gastrointestinal:  Negative for abdominal pain, blood in stool, constipation, diarrhea, nausea and vomiting.  Genitourinary:  Negative for hematuria.   Musculoskeletal:  Positive for  arthralgias.  Skin: Negative.   Neurological:  Negative for dizziness, headaches and light-headedness.  Hematological:  Does not bruise/bleed easily.     MEDICAL HISTORY:  Essential hypertension Elevated WBC count Hypothyroidism Obesity (BMI 31.0-31.9)   SOCIAL HISTORY:  Social History   Socioeconomic History   Marital status: Divorced    Spouse name: Not on file   Number of children: Not on file   Years of education: Not on file   Highest education level: Not on file  Occupational History   Not on file  Tobacco Use   Smoking status: Not on file   Smokeless tobacco: Not on file  Substance and Sexual Activity   Alcohol use: Not on file   Drug use: Not on file   Sexual activity: Not on file  Other Topics Concern   Not on file  Social History Narrative   Not on file   Social Determinants of Health   Financial Resource Strain: Not on file  Food Insecurity: Not on file  Transportation Needs: Not on file  Physical Activity: Not on file  Stress: Not on file  Social Connections: Not on file  Intimate Partner Violence: Not on file    FAMILY HISTORY:  No family history on file.  CURRENT MEDICATIONS:  Outpatient Encounter Medications as of 04/03/2022  Medication Sig   SYNTHROID 25 MCG tablet Take 25 mcg by mouth daily.   No facility-administered encounter medications on file as of 04/03/2022.    ALLERGIES:  Allergies  Allergen Reactions   Augmentin [Amoxicillin-Pot Clavulanate]  Nausea And Vomiting     PHYSICAL EXAM:    ECOG PERFORMANCE STATUS: 0 - Asymptomatic  Vitals:   04/03/22 1412  BP: (!) 209/92  Pulse: 79  Resp: 18  Temp: 98.8 F (37.1 C)  SpO2: 99%   Filed Weights   04/03/22 1412  Weight: 158 lb 6.4 oz (71.8 kg)   Physical Exam Constitutional:      Appearance: Normal appearance. She is obese.  HENT:     Head: Normocephalic and atraumatic.     Mouth/Throat:     Mouth: Mucous membranes are moist.  Eyes:     Extraocular Movements:  Extraocular movements intact.     Pupils: Pupils are equal, round, and reactive to light.  Cardiovascular:     Rate and Rhythm: Normal rate and regular rhythm.     Pulses: Normal pulses.     Heart sounds: Normal heart sounds.  Pulmonary:     Effort: Pulmonary effort is normal.     Breath sounds: Normal breath sounds.  Abdominal:     General: Bowel sounds are normal.     Palpations: Abdomen is soft. There is no hepatomegaly or splenomegaly.     Tenderness: There is no abdominal tenderness.  Musculoskeletal:        General: No swelling.     Right lower leg: No edema.     Left lower leg: No edema.  Lymphadenopathy:     Head:     Right side of head: No submental, submandibular, tonsillar, preauricular, posterior auricular or occipital adenopathy.     Left side of head: No submental, submandibular, tonsillar, preauricular, posterior auricular or occipital adenopathy.     Cervical: No cervical adenopathy.     Right cervical: No superficial, deep or posterior cervical adenopathy.    Left cervical: No superficial, deep or posterior cervical adenopathy.     Upper Body:     Right upper body: No supraclavicular, axillary, pectoral or epitrochlear adenopathy.     Left upper body: No supraclavicular, axillary or pectoral adenopathy.     Lower Body: No right inguinal adenopathy. No left inguinal adenopathy.  Skin:    General: Skin is warm and dry.  Neurological:     General: No focal deficit present.     Mental Status: She is alert and oriented to person, place, and time.  Psychiatric:        Mood and Affect: Mood normal.        Behavior: Behavior normal.      LABORATORY DATA:  I have reviewed the labs as listed.  CBC    Component Value Date/Time   WBC 15.4 (H) 04/03/2022 1245   RBC 4.31 04/03/2022 1245   HGB 12.7 04/03/2022 1245   HCT 38.9 04/03/2022 1245   PLT 247 04/03/2022 1245   MCV 90.3 04/03/2022 1245   MCH 29.5 04/03/2022 1245   MCHC 32.6 04/03/2022 1245   RDW 13.7  04/03/2022 1245   LYMPHSABS 11.1 (H) 04/03/2022 1245   MONOABS 1.2 (H) 04/03/2022 1245   EOSABS 0.1 04/03/2022 1245   BASOSABS 0.1 04/03/2022 1245      Latest Ref Rng & Units 11/09/2021   10:22 AM 11/03/2009   12:00 PM 07/26/2009    3:53 PM  CMP  Glucose 70 - 99 mg/dL 107  102  90   BUN 8 - 23 mg/dL 12  7  9    Creatinine 0.44 - 1.00 mg/dL 0.86  0.64  0.7   Sodium 135 - 145 mmol/L 135  138  140   Potassium 3.5 - 5.1 mmol/L 3.7  3.8  3.9   Chloride 98 - 111 mmol/L 101  105  103   CO2 22 - 32 mmol/L 23  28    Calcium 8.9 - 10.3 mg/dL 9.2  9.1    Total Protein 6.5 - 8.1 g/dL 7.7  6.7    Total Bilirubin 0.3 - 1.2 mg/dL 0.4  0.4    Alkaline Phos 38 - 126 U/L 57  61    AST 15 - 41 U/L 19  18    ALT 0 - 44 U/L 12  17      DIAGNOSTIC IMAGING:  I have independently reviewed the relevant imaging and discussed with the patient.  ASSESSMENT & PLAN: 1.  Stage 0 chronic lymphocytic leukemia - Patient seen at the request of her PCP (Dr. Judd Lien of [place]) for evaluation of lymphocyte predominant leukocytosis. - Flow cytometry results from 11/09/2021 which showed monoclonal B-cell population, positive for CD5, CD20, CD19, negative for CD10. - She has longstanding chronic sinusitis requiring antibiotics 2-3 times per year, but denies any other frequent infections.   - Mild early satiety since she had COVID in December 2021.  Lost 20 pounds during COVID, but weight has been stable since that time. - She had COVID-19 again in March 2023.  Lost about 5 pounds during COVID-19, but weight has otherwise been stable. - No B symptoms or unexplained weight loss.   - No lymphadenopathy or hepatosplenomegaly palpated on exam    - CBC (10/18/2021 via PCP) with WBC 18.5 and ALC 12.4 - CBC (11/09/2021): WBC 16.5, lymphocytes 7.9, monocytes and eosinophils slightly elevated.  Normal Hgb and PLT.  LDH normal. - CBC (04/03/2022): WBC 15.4 with ALC 11.1.  LDH normal. - We have discussed normal  pathophysiology of chronic lymphocytic leukemia.  As she is asymptomatic, recommend close observation. - We have discussed treatment indications which include B symptoms like fevers, night sweats and weight loss of more than 10% of body weight (unintentional), anemia, thrombocytopenia, neutropenia, painful splenomegaly, painful lymphadenopathy and recurrent infections.  We will do further testing in the form of CLL FISH panel, I GVH mutation, T p53 mutation if there is any indication for treatment. - PLAN: Since she is overall stable, we will switch to every 37-monthfollow-up.    2.  Elevated blood pressure - Initial blood pressure today was 209/92. - Patient admits to feeling anxious about today's visit, and visibly tenses when her blood pressure is being checked. - Repeat blood pressure was 187/67. - She does not take any antihypertensive medications at home. - She denies any headache, vision changes, neurologic deficits, chest pain, shortness of breath. - PLAN: Since she is asymptomatic and blood pressure is trending down, we will discharge her home, but with strict instructions on checking repeat blood pressure 30 to 60 minutes after she gets home and seeking additional medical attention if her blood pressure remains elevated or she develops any of the above symptoms.  3.  Other history - PMH: Hypertension, hypothyroidism, obesity - SOCIAL: Retired from [employer] several hospitals. No tobacco or drugs.  Occasional alcohol (1 beer per month on average) - FAMILY: Son with MDS that transitioned to AML (in his 569s.  Maternal uncles x2 with unspecified leukemia.   PLAN SUMMARY & DISPOSITION: Labs and follow-up in 6 months.  All questions were answered. The patient knows to call the clinic with any problems, questions or concerns.  Medical  decision making: Moderate  Time spent on visit: I spent 20 minutes counseling the patient face to face. The total time spent in the appointment was  30 minutes and more than 50% was on counseling.   Harriett Rush, PA-C  04/04/2022 3:05 PM

## 2022-04-04 ENCOUNTER — Encounter (HOSPITAL_COMMUNITY): Payer: Self-pay | Admitting: Physician Assistant

## 2022-04-12 DIAGNOSIS — R739 Hyperglycemia, unspecified: Secondary | ICD-10-CM | POA: Diagnosis not present

## 2022-04-12 DIAGNOSIS — E7849 Other hyperlipidemia: Secondary | ICD-10-CM | POA: Diagnosis not present

## 2022-04-12 DIAGNOSIS — E782 Mixed hyperlipidemia: Secondary | ICD-10-CM | POA: Diagnosis not present

## 2022-04-12 DIAGNOSIS — E039 Hypothyroidism, unspecified: Secondary | ICD-10-CM | POA: Diagnosis not present

## 2022-04-12 DIAGNOSIS — I10 Essential (primary) hypertension: Secondary | ICD-10-CM | POA: Diagnosis not present

## 2022-04-25 DIAGNOSIS — C9110 Chronic lymphocytic leukemia of B-cell type not having achieved remission: Secondary | ICD-10-CM | POA: Diagnosis not present

## 2022-04-25 DIAGNOSIS — Z6831 Body mass index (BMI) 31.0-31.9, adult: Secondary | ICD-10-CM | POA: Diagnosis not present

## 2022-04-25 DIAGNOSIS — I10 Essential (primary) hypertension: Secondary | ICD-10-CM | POA: Diagnosis not present

## 2022-04-25 DIAGNOSIS — E039 Hypothyroidism, unspecified: Secondary | ICD-10-CM | POA: Diagnosis not present

## 2022-04-25 DIAGNOSIS — E7849 Other hyperlipidemia: Secondary | ICD-10-CM | POA: Diagnosis not present

## 2022-05-29 ENCOUNTER — Ambulatory Visit: Payer: No Typology Code available for payment source | Admitting: Cardiovascular Disease

## 2022-06-28 DIAGNOSIS — Z23 Encounter for immunization: Secondary | ICD-10-CM | POA: Diagnosis not present

## 2022-07-20 DIAGNOSIS — Z6831 Body mass index (BMI) 31.0-31.9, adult: Secondary | ICD-10-CM | POA: Diagnosis not present

## 2022-07-20 DIAGNOSIS — E7849 Other hyperlipidemia: Secondary | ICD-10-CM | POA: Diagnosis not present

## 2022-07-20 DIAGNOSIS — E039 Hypothyroidism, unspecified: Secondary | ICD-10-CM | POA: Diagnosis not present

## 2022-07-20 DIAGNOSIS — Z0001 Encounter for general adult medical examination with abnormal findings: Secondary | ICD-10-CM | POA: Diagnosis not present

## 2022-07-20 DIAGNOSIS — I10 Essential (primary) hypertension: Secondary | ICD-10-CM | POA: Diagnosis not present

## 2022-07-21 DIAGNOSIS — I10 Essential (primary) hypertension: Secondary | ICD-10-CM | POA: Diagnosis not present

## 2022-07-21 DIAGNOSIS — E039 Hypothyroidism, unspecified: Secondary | ICD-10-CM | POA: Diagnosis not present

## 2022-07-21 DIAGNOSIS — C9110 Chronic lymphocytic leukemia of B-cell type not having achieved remission: Secondary | ICD-10-CM | POA: Diagnosis not present

## 2022-07-21 DIAGNOSIS — R739 Hyperglycemia, unspecified: Secondary | ICD-10-CM | POA: Diagnosis not present

## 2022-07-21 DIAGNOSIS — E7849 Other hyperlipidemia: Secondary | ICD-10-CM | POA: Diagnosis not present

## 2022-07-21 DIAGNOSIS — Z23 Encounter for immunization: Secondary | ICD-10-CM | POA: Diagnosis not present

## 2022-07-21 DIAGNOSIS — Z131 Encounter for screening for diabetes mellitus: Secondary | ICD-10-CM | POA: Diagnosis not present

## 2022-07-26 DIAGNOSIS — E039 Hypothyroidism, unspecified: Secondary | ICD-10-CM | POA: Diagnosis not present

## 2022-07-26 DIAGNOSIS — Z6832 Body mass index (BMI) 32.0-32.9, adult: Secondary | ICD-10-CM | POA: Diagnosis not present

## 2022-07-26 DIAGNOSIS — I10 Essential (primary) hypertension: Secondary | ICD-10-CM | POA: Diagnosis not present

## 2022-07-26 DIAGNOSIS — E7849 Other hyperlipidemia: Secondary | ICD-10-CM | POA: Diagnosis not present

## 2022-07-26 DIAGNOSIS — J0101 Acute recurrent maxillary sinusitis: Secondary | ICD-10-CM | POA: Diagnosis not present

## 2022-07-26 DIAGNOSIS — Z23 Encounter for immunization: Secondary | ICD-10-CM | POA: Diagnosis not present

## 2022-07-26 DIAGNOSIS — E782 Mixed hyperlipidemia: Secondary | ICD-10-CM | POA: Diagnosis not present

## 2022-07-26 DIAGNOSIS — C9110 Chronic lymphocytic leukemia of B-cell type not having achieved remission: Secondary | ICD-10-CM | POA: Diagnosis not present

## 2022-08-22 DIAGNOSIS — H5203 Hypermetropia, bilateral: Secondary | ICD-10-CM | POA: Diagnosis not present

## 2022-08-22 DIAGNOSIS — H2513 Age-related nuclear cataract, bilateral: Secondary | ICD-10-CM | POA: Diagnosis not present

## 2022-08-22 DIAGNOSIS — H52223 Regular astigmatism, bilateral: Secondary | ICD-10-CM | POA: Diagnosis not present

## 2022-08-22 DIAGNOSIS — H524 Presbyopia: Secondary | ICD-10-CM | POA: Diagnosis not present

## 2022-08-22 DIAGNOSIS — H3581 Retinal edema: Secondary | ICD-10-CM | POA: Diagnosis not present

## 2022-09-07 DIAGNOSIS — H43813 Vitreous degeneration, bilateral: Secondary | ICD-10-CM | POA: Diagnosis not present

## 2022-09-07 DIAGNOSIS — H25813 Combined forms of age-related cataract, bilateral: Secondary | ICD-10-CM | POA: Diagnosis not present

## 2022-09-07 DIAGNOSIS — H353132 Nonexudative age-related macular degeneration, bilateral, intermediate dry stage: Secondary | ICD-10-CM | POA: Diagnosis not present

## 2022-09-07 DIAGNOSIS — H43393 Other vitreous opacities, bilateral: Secondary | ICD-10-CM | POA: Diagnosis not present

## 2022-09-07 DIAGNOSIS — H35033 Hypertensive retinopathy, bilateral: Secondary | ICD-10-CM | POA: Diagnosis not present

## 2022-09-09 DIAGNOSIS — H6121 Impacted cerumen, right ear: Secondary | ICD-10-CM | POA: Diagnosis not present

## 2022-09-09 DIAGNOSIS — R030 Elevated blood-pressure reading, without diagnosis of hypertension: Secondary | ICD-10-CM | POA: Diagnosis not present

## 2022-09-09 DIAGNOSIS — J329 Chronic sinusitis, unspecified: Secondary | ICD-10-CM | POA: Diagnosis not present

## 2022-09-09 DIAGNOSIS — R059 Cough, unspecified: Secondary | ICD-10-CM | POA: Diagnosis not present

## 2022-09-09 DIAGNOSIS — Z20828 Contact with and (suspected) exposure to other viral communicable diseases: Secondary | ICD-10-CM | POA: Diagnosis not present

## 2022-09-09 DIAGNOSIS — H6692 Otitis media, unspecified, left ear: Secondary | ICD-10-CM | POA: Diagnosis not present

## 2022-09-09 DIAGNOSIS — Z6827 Body mass index (BMI) 27.0-27.9, adult: Secondary | ICD-10-CM | POA: Diagnosis not present

## 2022-10-03 ENCOUNTER — Inpatient Hospital Stay: Payer: No Typology Code available for payment source

## 2022-10-03 ENCOUNTER — Ambulatory Visit: Payer: No Typology Code available for payment source | Admitting: Physician Assistant

## 2023-03-07 DIAGNOSIS — K047 Periapical abscess without sinus: Secondary | ICD-10-CM | POA: Diagnosis not present

## 2023-03-07 DIAGNOSIS — E039 Hypothyroidism, unspecified: Secondary | ICD-10-CM | POA: Diagnosis not present

## 2023-03-07 DIAGNOSIS — Z6832 Body mass index (BMI) 32.0-32.9, adult: Secondary | ICD-10-CM | POA: Diagnosis not present

## 2023-03-07 DIAGNOSIS — R030 Elevated blood-pressure reading, without diagnosis of hypertension: Secondary | ICD-10-CM | POA: Diagnosis not present

## 2023-03-07 DIAGNOSIS — R739 Hyperglycemia, unspecified: Secondary | ICD-10-CM | POA: Diagnosis not present

## 2023-03-07 DIAGNOSIS — S1096XA Insect bite of unspecified part of neck, initial encounter: Secondary | ICD-10-CM | POA: Diagnosis not present

## 2023-03-07 DIAGNOSIS — W57XXXA Bitten or stung by nonvenomous insect and other nonvenomous arthropods, initial encounter: Secondary | ICD-10-CM | POA: Diagnosis not present

## 2023-03-07 DIAGNOSIS — E7849 Other hyperlipidemia: Secondary | ICD-10-CM | POA: Diagnosis not present

## 2023-03-07 DIAGNOSIS — I10 Essential (primary) hypertension: Secondary | ICD-10-CM | POA: Diagnosis not present

## 2023-03-07 DIAGNOSIS — E782 Mixed hyperlipidemia: Secondary | ICD-10-CM | POA: Diagnosis not present

## 2023-04-12 ENCOUNTER — Encounter: Payer: Self-pay | Admitting: Internal Medicine

## 2023-04-12 ENCOUNTER — Ambulatory Visit: Payer: No Typology Code available for payment source | Attending: Internal Medicine | Admitting: Internal Medicine

## 2023-04-12 VITALS — BP 140/82 | HR 84 | Ht 60.0 in | Wt 168.0 lb

## 2023-04-12 DIAGNOSIS — R0602 Shortness of breath: Secondary | ICD-10-CM

## 2023-04-12 DIAGNOSIS — I10 Essential (primary) hypertension: Secondary | ICD-10-CM | POA: Diagnosis not present

## 2023-04-12 DIAGNOSIS — R0600 Dyspnea, unspecified: Secondary | ICD-10-CM | POA: Diagnosis not present

## 2023-04-12 DIAGNOSIS — E7849 Other hyperlipidemia: Secondary | ICD-10-CM | POA: Diagnosis not present

## 2023-04-12 DIAGNOSIS — Z8249 Family history of ischemic heart disease and other diseases of the circulatory system: Secondary | ICD-10-CM

## 2023-04-12 DIAGNOSIS — E785 Hyperlipidemia, unspecified: Secondary | ICD-10-CM | POA: Insufficient documentation

## 2023-04-12 DIAGNOSIS — Z136 Encounter for screening for cardiovascular disorders: Secondary | ICD-10-CM | POA: Diagnosis not present

## 2023-04-12 NOTE — Patient Instructions (Signed)
Medication Instructions:  Your physician recommends that you continue on your current medications as directed. Please refer to the Current Medication list given to you today.   Labwork: None  Testing/Procedures: Your physician has requested that you have an echocardiogram. Echocardiography is a painless test that uses sound waves to create images of your heart. It provides your doctor with information about the size and shape of your heart and how well your heart's chambers and valves are working. This procedure takes approximately one hour. There are no restrictions for this procedure. Please do NOT wear cologne, perfume, aftershave, or lotions (deodorant is allowed). Please arrive 15 minutes prior to your appointment time.   Follow-Up: Your physician recommends that you schedule a follow-up appointment in: Pending Results  Any Other Special Instructions Will Be Listed Below (If Applicable).  If you need a refill on your cardiac medications before your next appointment, please call your pharmacy.  

## 2023-04-12 NOTE — Progress Notes (Signed)
Cardiology Office Note  Date: 04/12/2023   ID: Tracy Curtis, DOB 03-May-1941, MRN [id number]  PCP:  Juliette Alcide, MD  Cardiologist:  Marjo Bicker, MD Electrophysiologist:  None    History of Present Illness: Tracy Curtis is a 82 y.o. female known to have HTN, HLD, CLL was referred to cardiology clinic for HTN management.  Patient does not have angina.  Has some SOB with exertional activities, but no orthopnea, PND or leg swelling.  No dizziness, palpitations or syncope.  She has a family history of CAD, history of CABG in the family.   Past Medical History:  Diagnosis Date   BMI 31.0-31.9,adult    Chest tightness    CLL (chronic lymphocytic leukemia) (HCC)    Ear pain    HTN (hypertension)    Hyperlipidemia    Hypothyroid    Otalgia    SOB (shortness of breath)     Current Outpatient Medications  Medication Sig Dispense Refill   losartan (COZAAR) 50 MG tablet Take 50 mg by mouth as needed (only takes occasionally).     rosuvastatin (CRESTOR) 5 MG tablet Take 2.5 mg by mouth as directed. Takes maybe once every 2-3 months     SYNTHROID 25 MCG tablet Take 25 mcg by mouth daily.     No current facility-administered medications for this visit.   Allergies:  Augmentin [amoxicillin-pot clavulanate]   Social History: The patient  reports that she has never smoked. She has never used smokeless tobacco. She reports that she does not drink alcohol and does not use drugs.   Family History: The patient's family history is not on file.   ROS:  Please see the history of present illness. Otherwise, complete review of systems is positive for none  All other systems are reviewed and negative.   Physical Exam: VS:  BP 138/86   Pulse 84   Ht 5' (1.524 m)   Wt 168 lb (76.2 kg)   SpO2 96%   BMI 32.81 kg/m , BMI Body mass index is 32.81 kg/m.  Wt Readings from Last 3 Encounters:  04/12/23 168 lb (76.2 kg)  04/03/22 158 lb 6.4 oz (71.8 kg)  11/30/21 158 lb 9.6 oz (71.9 kg)     General: Patient appears comfortable at rest. HEENT: Conjunctiva and lids normal, oropharynx clear with moist mucosa. Neck: Supple, no elevated JVP or carotid bruits, no thyromegaly. Lungs: Clear to auscultation, nonlabored breathing at rest. Cardiac: Regular rate and rhythm, no S3 or significant systolic murmur, no pericardial rub. Abdomen: Soft, nontender, no hepatomegaly, bowel sounds present, no guarding or rebound. Extremities: No pitting edema, distal pulses 2+. Skin: Warm and dry. Musculoskeletal: No kyphosis. Neuropsychiatric: Alert and oriented x3, affect grossly appropriate.  Recent Labwork: No results found for requested labs within last 365 days.  No results found for: "CHOL", "TRIG", "HDL", "CHOLHDL", "VLDL", "LDLCALC", "LDLDIRECT"   Assessment and Plan:  # SOB -Patient has some SOB with exertional activities.  No orthopnea, PND or leg swelling.  Obtain 2D echocardiogram.  # HTN, controlled -Continue losartan 50 mg (takes occasionally according to the chart), follows with PCP. -Blood pressures on bilateral upper extremities are 136/86 in right and 140/82 left. 2+ radial pulses bilaterally.  No discrepancy.  # HLD -Continue rosuvastatin 2.5 mg (takes once in 2 to 3 months).  Follows with PCP for HLD management. Due to family history of CAD, patient is requesting lipoprotein a levels to be checked.  Obtain lipoprotein a level.  Otherwise, she  has no angina.  Some SOB with exertional activities.   I have spent a total of 45 minutes with patient reviewing chart, EKGs, labs and examining patient as well as establishing an assessment and plan that was discussed with the patient.  > 50% of time was spent in direct patient care.    Medication Adjustments/Labs and Tests Ordered: Current medicines are reviewed at length with the patient today.  Concerns regarding medicines are outlined above.   Tests Ordered: Orders Placed This Encounter  Procedures   Lipoprotein A (LPA)    EKG 12-Lead   ECHOCARDIOGRAM COMPLETE    Medication Changes: No orders of the defined types were placed in this encounter.   Disposition:  Follow up  pending results  Signed Shantea Poulton Verne Spurr, MD, 04/12/2023 3:12 PM    [facility] at [facility] [address] [place], [place], [place] [address]

## 2023-04-18 ENCOUNTER — Telehealth: Payer: Self-pay | Admitting: Internal Medicine

## 2023-04-18 DIAGNOSIS — R0600 Dyspnea, unspecified: Secondary | ICD-10-CM

## 2023-04-18 NOTE — Telephone Encounter (Signed)
Mrs. Cyphers called the office today regarding her brother that had AAA surgery  approximately (2) years ago. She stated that the doctor told all family members that they need to have an AAA. Patient requested that Dr. Jenene Slicker be notified. She is wanting to know if Dr. Jenene Slicker will order this test for her.

## 2023-04-19 ENCOUNTER — Ambulatory Visit: Payer: No Typology Code available for payment source | Attending: Internal Medicine

## 2023-04-19 DIAGNOSIS — R0600 Dyspnea, unspecified: Secondary | ICD-10-CM

## 2023-04-19 LAB — ECHOCARDIOGRAM COMPLETE
AR max vel: 2.49 cm2
AV Area VTI: 2.36 cm2
AV Area mean vel: 2.53 cm2
AV Mean grad: 2.9 mmHg
AV Peak grad: 6 mmHg
Ao pk vel: 1.22 m/s
Area-P 1/2: 3.6 cm2
Calc EF: 59.8 %
MV M vel: 4.71 m/s
MV Peak grad: 88.7 mmHg
S' Lateral: 3.2 cm
Single Plane A2C EF: 60.3 %
Single Plane A4C EF: 59.1 %

## 2023-04-20 MED ORDER — FUROSEMIDE 20 MG PO TABS: 20.0000 mg | ORAL_TABLET | Freq: Every day | ORAL | 2 refills | Status: AC | PRN

## 2023-04-20 NOTE — Telephone Encounter (Signed)
-----   Message from Vishnu P Mallipeddi sent at 04/20/2023  8:26 AM EDT ----- Normal pumping function of the heart, G1 DD (mildly stiff heart, could be normal for her age), mild MR (leakiness across the mitral valve). Patient has DOE, can start p.o. Lasix 20 mg as needed for SOB/LE swelling.  Schedule follow-up in 6 months.

## 2023-04-20 NOTE — Telephone Encounter (Signed)
Called patient gave results and advised that she wanted a have a copy. Sent PCP copy. Also wanted more information regarding her results made her an appointment with Dr. Jenene Slicker to address her concerns

## 2023-04-23 ENCOUNTER — Encounter: Payer: No Typology Code available for payment source | Admitting: Internal Medicine

## 2023-04-23 NOTE — Progress Notes (Signed)
Erroneous encounter - please disregard.

## 2023-05-31 DIAGNOSIS — H43813 Vitreous degeneration, bilateral: Secondary | ICD-10-CM | POA: Diagnosis not present

## 2023-05-31 DIAGNOSIS — E785 Hyperlipidemia, unspecified: Secondary | ICD-10-CM | POA: Diagnosis not present

## 2023-05-31 DIAGNOSIS — H25813 Combined forms of age-related cataract, bilateral: Secondary | ICD-10-CM | POA: Diagnosis not present

## 2023-05-31 DIAGNOSIS — H353132 Nonexudative age-related macular degeneration, bilateral, intermediate dry stage: Secondary | ICD-10-CM | POA: Diagnosis not present

## 2023-05-31 DIAGNOSIS — H43393 Other vitreous opacities, bilateral: Secondary | ICD-10-CM | POA: Diagnosis not present

## 2023-05-31 DIAGNOSIS — Z8249 Family history of ischemic heart disease and other diseases of the circulatory system: Secondary | ICD-10-CM | POA: Diagnosis not present

## 2023-05-31 DIAGNOSIS — H35033 Hypertensive retinopathy, bilateral: Secondary | ICD-10-CM | POA: Diagnosis not present

## 2023-06-02 LAB — LIPOPROTEIN A (LPA): Lipoprotein (a): <8.4 nmol/L (ref <75.0)

## 2023-06-05 DIAGNOSIS — E039 Hypothyroidism, unspecified: Secondary | ICD-10-CM | POA: Diagnosis not present

## 2023-06-05 DIAGNOSIS — E785 Hyperlipidemia, unspecified: Secondary | ICD-10-CM | POA: Diagnosis not present

## 2023-06-05 DIAGNOSIS — C9110 Chronic lymphocytic leukemia of B-cell type not having achieved remission: Secondary | ICD-10-CM | POA: Diagnosis not present

## 2023-06-05 DIAGNOSIS — Z6831 Body mass index (BMI) 31.0-31.9, adult: Secondary | ICD-10-CM | POA: Diagnosis not present

## 2023-06-05 DIAGNOSIS — R7303 Prediabetes: Secondary | ICD-10-CM | POA: Diagnosis not present

## 2023-06-05 DIAGNOSIS — Z008 Encounter for other general examination: Secondary | ICD-10-CM | POA: Diagnosis not present

## 2023-06-05 DIAGNOSIS — E669 Obesity, unspecified: Secondary | ICD-10-CM | POA: Diagnosis not present

## 2023-08-15 DIAGNOSIS — H3530 Unspecified macular degeneration: Secondary | ICD-10-CM | POA: Diagnosis not present

## 2023-08-15 DIAGNOSIS — H25813 Combined forms of age-related cataract, bilateral: Secondary | ICD-10-CM | POA: Diagnosis not present

## 2023-08-15 DIAGNOSIS — H53002 Unspecified amblyopia, left eye: Secondary | ICD-10-CM | POA: Diagnosis not present

## 2023-08-15 DIAGNOSIS — H35033 Hypertensive retinopathy, bilateral: Secondary | ICD-10-CM | POA: Diagnosis not present

## 2023-08-15 DIAGNOSIS — H43813 Vitreous degeneration, bilateral: Secondary | ICD-10-CM | POA: Diagnosis not present

## 2023-08-29 DIAGNOSIS — E039 Hypothyroidism, unspecified: Secondary | ICD-10-CM | POA: Diagnosis not present

## 2023-08-29 DIAGNOSIS — K047 Periapical abscess without sinus: Secondary | ICD-10-CM | POA: Diagnosis not present

## 2023-08-29 DIAGNOSIS — E782 Mixed hyperlipidemia: Secondary | ICD-10-CM | POA: Diagnosis not present

## 2023-08-29 DIAGNOSIS — I10 Essential (primary) hypertension: Secondary | ICD-10-CM | POA: Diagnosis not present

## 2023-08-29 DIAGNOSIS — Z6833 Body mass index (BMI) 33.0-33.9, adult: Secondary | ICD-10-CM | POA: Diagnosis not present

## 2023-08-29 DIAGNOSIS — C9110 Chronic lymphocytic leukemia of B-cell type not having achieved remission: Secondary | ICD-10-CM | POA: Diagnosis not present

## 2023-08-29 DIAGNOSIS — E7849 Other hyperlipidemia: Secondary | ICD-10-CM | POA: Diagnosis not present

## 2023-08-29 DIAGNOSIS — Z0001 Encounter for general adult medical examination with abnormal findings: Secondary | ICD-10-CM | POA: Diagnosis not present

## 2023-08-29 DIAGNOSIS — J0101 Acute recurrent maxillary sinusitis: Secondary | ICD-10-CM | POA: Diagnosis not present

## 2023-08-29 DIAGNOSIS — Z7689 Persons encountering health services in other specified circumstances: Secondary | ICD-10-CM | POA: Diagnosis not present

## 2023-08-29 DIAGNOSIS — R739 Hyperglycemia, unspecified: Secondary | ICD-10-CM | POA: Diagnosis not present

## 2023-09-18 ENCOUNTER — Encounter: Payer: Self-pay | Admitting: Family Medicine

## 2023-10-17 ENCOUNTER — Encounter: Payer: Self-pay | Admitting: Internal Medicine

## 2023-10-17 ENCOUNTER — Ambulatory Visit: Payer: No Typology Code available for payment source | Attending: Internal Medicine | Admitting: Internal Medicine

## 2023-10-17 VITALS — BP 132/76 | HR 72 | Ht 60.0 in | Wt 170.4 lb

## 2023-10-17 DIAGNOSIS — R079 Chest pain, unspecified: Secondary | ICD-10-CM | POA: Insufficient documentation

## 2023-10-17 DIAGNOSIS — I10 Essential (primary) hypertension: Secondary | ICD-10-CM | POA: Diagnosis not present

## 2023-10-17 MED ORDER — NITROGLYCERIN 0.4 MG SL SUBL: 0.4000 mg | SUBLINGUAL_TABLET | SUBLINGUAL | 2 refills | Status: AC | PRN

## 2023-10-17 MED ORDER — METOPROLOL TARTRATE 25 MG PO TABS: 25.0000 mg | ORAL_TABLET | Freq: Two times a day (BID) | ORAL | 5 refills | Status: AC

## 2023-10-17 NOTE — Patient Instructions (Addendum)
 Medication Instructions:  Your physician has recommended you make the following change in your medication:  Start taking Metoprolol  tartrate 25 mg twice daily Nitroglycerin  0.4 mg as for chest pain Dissolve one under tongue for chest pain every 5 minutes up to 3 doses. If no relief, proceed to ED or call 911 Continue taking all other medications as prescribed  Labwork: None  Testing/Procedures:   Your cardiac CT will be scheduled at one of the below locations:   [facility] [address] [place], [place] [address] [phone]   If scheduled at [facility], please arrive at the [facility] and Children's Entrance (Entrance C2) of [facility] 30 minutes prior to test start time. You can use the FREE valet parking offered at entrance C (encouraged to control the heart rate for the test)  Proceed to the [facility] Radiology Department (first floor) to check-in and test prep.  All radiology patients and guests should use entrance C2 at [facility], accessed from [facility], even though the hospital's physical address listed is [address]     Please follow these instructions carefully (unless otherwise directed):  An IV will be required for this test and Nitroglycerin  will be given.   On the Night Before the Test: Be sure to Drink plenty of water. Do not consume any caffeinated/decaffeinated beverages or chocolate 12 hours prior to your test. Do not take any antihistamines 12 hours prior to your test. If the patient has contrast allergy: No allergy  On the Day of the Test: Drink plenty of water until 1 hour prior to the test. Do not eat any food 1 hour prior to test. You may take your regular medications prior to the test.  Take metoprolol  100 mg(Lopressor ) two hours prior to test. If you take Furosemide  please HOLD on the morning of the test. Patients who wear a continuous glucose monitor MUST remove the device  prior to scanning. FEMALES- please wear underwire-free bra if available, avoid dresses & tight clothing    After the Test: Drink plenty of water. After receiving IV contrast, you may experience a mild flushed feeling. This is normal. On occasion, you may experience a mild rash up to 24 hours after the test. This is not dangerous. If this occurs, you can take Benadryl 25 mg, Zyrtec, Claritin, or Allegra and increase your fluid intake. (Patients taking Tikosyn should avoid Benadryl, and may take Zyrtec, Claritin, or Allegra) If you experience trouble breathing, this can be serious. If it is severe call 911 IMMEDIATELY. If it is mild, please call our office.  We will call to schedule your test 2-4 weeks out understanding that some insurance companies will need an authorization prior to the service being performed.   For more information and frequently asked questions, please visit our website : [email or web address]  For non-scheduling related questions, please contact the cardiac imaging nurse navigator should you have any questions/concerns: Cardiac Imaging Nurse Navigators Direct Office Dial: [phone]   For scheduling needs, including cancellations and rescheduling, please call Brittany, [phone].   Follow-Up: Your physician recommends that you schedule a follow-up appointment in: 3 months  Any Other Special Instructions Will Be Listed Below (If Applicable). Thank you for choosing [facility]!      If you need a refill on your cardiac medications before your next appointment, please call your pharmacy.

## 2023-10-17 NOTE — Progress Notes (Signed)
 Cardiology Office Note  Date: 10/17/2023   ID: Tracy Curtis, DOB 05-20-1941, MRN [id number]  PCP:  Lari Elspeth BRAVO, MD  Cardiologist:  Diannah SHAUNNA Maywood, MD Electrophysiologist:  None    History of Present Illness: Tracy Curtis is a 83 y.o. female known to have HTN, HLD, CLL is here for follow-up visit.  She reports ongoing substernal and exertional chest tightness x few months, lasting for few minutes and resolving with rest.  Associated with SOB.  No other symptoms of dizziness, palpitations, presyncope, syncope and leg swelling.  No orthopnea or PND either.  We reviewed the echocardiogram findings in the clinic today.  Echo from August 2024 showed normal LVEF, G1 DD with normal LVEDP, mild MR.   Past Medical History:  Diagnosis Date   BMI 31.0-31.9,adult    Chest tightness    CLL (chronic lymphocytic leukemia) (HCC)    Ear pain    HTN (hypertension)    Hyperlipidemia    Hypothyroid    Otalgia    SOB (shortness of breath)     Current Outpatient Medications  Medication Sig Dispense Refill   furosemide  (LASIX ) 20 MG tablet Take 1 tablet (20 mg total) by mouth daily as needed. For shortness of breath and leg swelling 30 tablet 2   losartan (COZAAR) 50 MG tablet Take 50 mg by mouth as needed (only takes occasionally).     rosuvastatin (CRESTOR) 5 MG tablet Take 2.5 mg by mouth as directed. Takes maybe once every 2-3 months     SYNTHROID 25 MCG tablet Take 25 mcg by mouth daily.     No current facility-administered medications for this visit.   Allergies:  Augmentin [amoxicillin-pot clavulanate]   Social History: The patient  reports that she has never smoked. She has never used smokeless tobacco. She reports that she does not drink alcohol and does not use drugs.   Family History: The patient's family history is not on file.   ROS:  Please see the history of present illness. Otherwise, complete review of systems is positive for none  All other systems are reviewed and  negative.   Physical Exam: VS:  There were no vitals taken for this visit., BMI There is no height or weight on file to calculate BMI.  Wt Readings from Last 3 Encounters:  04/12/23 168 lb (76.2 kg)  04/03/22 158 lb 6.4 oz (71.8 kg)  11/30/21 158 lb 9.6 oz (71.9 kg)    General: Patient appears comfortable at rest. HEENT: Conjunctiva and lids normal, oropharynx clear with moist mucosa. Neck: Supple, no elevated JVP or carotid bruits, no thyromegaly. Lungs: Clear to auscultation, nonlabored breathing at rest. Cardiac: Regular rate and rhythm, no S3 or significant systolic murmur, no pericardial rub. Abdomen: Soft, nontender, no hepatomegaly, bowel sounds present, no guarding or rebound. Extremities: No pitting edema, distal pulses 2+. Skin: Warm and dry. Musculoskeletal: No kyphosis. Neuropsychiatric: Alert and oriented x3, affect grossly appropriate.  Recent Labwork: No results found for requested labs within last 365 days.  No results found for: CHOL, TRIG, HDL, CHOLHDL, VLDL, LDLCALC, LDLDIRECT   Assessment and Plan:   Cardiac chest pain: Ongoing substernal exertional chest tightness lasting for few minutes x few months and occurring almost daily with household chores.  Echocardiogram in 2024 showed normal LVEF, normal diastology and mild MR.  Obtain CT cardiac.  Start metoprolol  tartrate 25 mg twice daily, SL NTG 0.4 mg as needed and ER precautions for chest pain provided.  HTN, controlled: Continue losartan  50 mg once daily.  Follows with PCP.  HLD, unknown values: Previously used to take rosuvastatin 2.5 mg once in 2 to 3 months, currently not taking.  Can follow-up with PCP.    Medication Adjustments/Labs and Tests Ordered: Current medicines are reviewed at length with the patient today.  Concerns regarding medicines are outlined above.   Tests Ordered: No orders of the defined types were placed in this encounter.   Medication Changes: No orders of the  defined types were placed in this encounter.   Disposition:  Follow up 3 months  Signed Elan Brainerd Priya Kindal Ponti, MD, 10/17/2023 11:42 AM    [facility] at [facility] [address] [place], [place], [place] [address]

## 2023-10-30 DIAGNOSIS — R079 Chest pain, unspecified: Secondary | ICD-10-CM | POA: Diagnosis not present

## 2023-10-31 LAB — BASIC METABOLIC PANEL
BUN/Creatinine Ratio: 15 (ref 12–28)
BUN: 14 mg/dL (ref 8–27)
CO2: 24 mmol/L (ref 20–29)
Calcium: 9.4 mg/dL (ref 8.7–10.3)
Chloride: 105 mmol/L (ref 96–106)
Creatinine, Ser: 0.93 mg/dL (ref 0.57–1.00)
Glucose: 98 mg/dL (ref 70–99)
Potassium: 4.6 mmol/L (ref 3.5–5.2)
Sodium: 143 mmol/L (ref 134–144)
eGFR: 62 mL/min/{1.73_m2} (ref >59)

## 2023-11-05 ENCOUNTER — Ambulatory Visit (HOSPITAL_COMMUNITY): Payer: No Typology Code available for payment source

## 2023-11-09 ENCOUNTER — Encounter (HOSPITAL_COMMUNITY): Payer: Self-pay

## 2023-11-12 ENCOUNTER — Telehealth (HOSPITAL_COMMUNITY): Payer: Self-pay | Admitting: Emergency Medicine

## 2023-11-12 NOTE — Telephone Encounter (Signed)
 Attempted to call patient regarding upcoming cardiac CT appointment. Left message on voicemail with name and callback number Rockwell Alexandria RN Navigator Cardiac Imaging [facility] [phone] Office [phone] Cell

## 2023-11-13 ENCOUNTER — Ambulatory Visit (HOSPITAL_COMMUNITY)
Admission: RE | Admit: 2023-11-13 | Discharge: 2023-11-13 | Disposition: A | Discharge status: Home / Self Care | Payer: No Typology Code available for payment source | Source: Ambulatory Visit | Attending: Internal Medicine | Admitting: Internal Medicine

## 2023-11-13 DIAGNOSIS — R079 Chest pain, unspecified: Secondary | ICD-10-CM | POA: Diagnosis not present

## 2023-11-13 DIAGNOSIS — I2510 Atherosclerotic heart disease of native coronary artery without angina pectoris: Secondary | ICD-10-CM | POA: Diagnosis not present

## 2023-11-13 DIAGNOSIS — Q2112 Patent foramen ovale: Secondary | ICD-10-CM | POA: Diagnosis not present

## 2023-11-13 DIAGNOSIS — I700 Atherosclerosis of aorta: Secondary | ICD-10-CM | POA: Insufficient documentation

## 2023-11-13 DIAGNOSIS — I3481 Nonrheumatic mitral (valve) annulus calcification: Secondary | ICD-10-CM | POA: Diagnosis not present

## 2023-11-13 MED ORDER — NITROGLYCERIN 0.4 MG SL SUBL
0.8000 mg | SUBLINGUAL_TABLET | Freq: Once | SUBLINGUAL | Status: AC
  Administered 2023-11-13: 0.8 mg via SUBLINGUAL

## 2023-11-13 MED ORDER — IOHEXOL 350 MG/ML SOLN
100.0000 mL | Freq: Once | INTRAVENOUS | Status: AC | PRN
  Administered 2023-11-13: 100 mL via INTRAVENOUS

## 2023-11-13 MED ORDER — NITROGLYCERIN 0.4 MG SL SUBL
SUBLINGUAL_TABLET | SUBLINGUAL | Status: AC
  Filled 2023-11-13: qty 2

## 2023-11-20 ENCOUNTER — Telehealth: Payer: Self-pay

## 2023-11-20 NOTE — Telephone Encounter (Signed)
-----   Message from Vishnu P Mallipeddi sent at 11/19/2023  9:33 AM EDT ----- Coronary calcium score is 196, 68 percentile for age and sex matched control.  Mild nonobstructive CAD is present.  Chest pain is unlikely cardiac in origin.  Cannot rule out coronary microvascular disease.  Continue current medications.

## 2023-11-20 NOTE — Telephone Encounter (Signed)
 Left message and sent MyChart message regarding results. PCP copied

## 2023-11-30 DIAGNOSIS — R7303 Prediabetes: Secondary | ICD-10-CM | POA: Diagnosis not present

## 2023-11-30 DIAGNOSIS — E669 Obesity, unspecified: Secondary | ICD-10-CM | POA: Diagnosis not present

## 2023-11-30 DIAGNOSIS — Z008 Encounter for other general examination: Secondary | ICD-10-CM | POA: Diagnosis not present

## 2023-11-30 DIAGNOSIS — C9191 Lymphoid leukemia, unspecified, in remission: Secondary | ICD-10-CM | POA: Diagnosis not present

## 2023-11-30 DIAGNOSIS — E785 Hyperlipidemia, unspecified: Secondary | ICD-10-CM | POA: Diagnosis not present

## 2023-11-30 DIAGNOSIS — Z6832 Body mass index (BMI) 32.0-32.9, adult: Secondary | ICD-10-CM | POA: Diagnosis not present

## 2023-11-30 DIAGNOSIS — E039 Hypothyroidism, unspecified: Secondary | ICD-10-CM | POA: Diagnosis not present

## 2023-11-30 DIAGNOSIS — I10 Essential (primary) hypertension: Secondary | ICD-10-CM | POA: Diagnosis not present

## 2024-01-07 ENCOUNTER — Other Ambulatory Visit: Payer: Self-pay | Admitting: Physician Assistant

## 2024-01-07 DIAGNOSIS — C9110 Chronic lymphocytic leukemia of B-cell type not having achieved remission: Secondary | ICD-10-CM

## 2024-01-07 NOTE — Progress Notes (Signed)
 Reestablishing care for CLL (lost to follow-up) Labs to be checked at [facility] = CBC/D, CMP, LDH

## 2024-01-09 DIAGNOSIS — C9110 Chronic lymphocytic leukemia of B-cell type not having achieved remission: Secondary | ICD-10-CM | POA: Diagnosis not present

## 2024-01-12 NOTE — Progress Notes (Unsigned)
 [facility] [address] [place], [place] [address]   CLINIC:  Medical Oncology/Hematology  PCP:  Alston Jerry, MD [address] [place] [address] [phone]   REASON FOR VISIT:  Follow-up for stage 0 CLL   PRIOR THERAPY: None   CURRENT THERAPY: Surveillance  INTERVAL HISTORY:   [name] 83 y.o. female returns for routine follow-up of stage 0 CLL.  She was last seen by Sheril Dines PA-C on 04/03/2022, but was lost to follow-up after that visit.  She returns today to reestablish care.  At today's visit, she reports feeling ***.  No recent hospitalizations, surgeries, or changes in baseline health status.  She denies any new lumps or bumps.  *** She  has not had any fever, chills, night sweats, or unintentional weight loss.  *** She  has not had any extreme fatigue or frequent infections.  *** She  denies symptoms of hyperviscosity such as headache, dizziness, tinnitus, or vision changes.  *** No early satiety or abdominal pain.  *** She  denies any signs or symptoms of blood loss.  *** No chest pain, shortness of breath, syncope, or palpitations.  ***   She has ***% energy and ***% appetite. She endorses that she is maintaining a stable weight.   ASSESSMENT & PLAN:  1.  Stage 0 chronic lymphocytic leukemia - Patient initially seen at the request of her PCP (Dr. Vallarie Gauze of [facility]) for evaluation of lymphocyte predominant leukocytosis. - Flow cytometry results from 11/09/2021 which showed monoclonal B-cell population, positive for CD5, CD20, CD19, negative for CD10. - She has longstanding chronic sinusitis requiring antibiotics 2-3 times per year, but denies any other frequent infections.   - Lost some weight due to COVID infection in 2021 and again in 2023.  No other unexpected or unexplained weight loss.  *** - No B symptoms or unexplained weight loss.  *** - No lymphadenopathy or hepatosplenomegaly palpated on exam   *** -  Lost to follow-up after July 2023.  Labs from 04/03/2022 showed WBC 15.4/ALC 11.1/monocytes 1.2 - Most recent labs (via [facility]): (01/09/2024): WBC 24.9, 80% lymphocytes (including smudge cells and atypical lymphocytes), absolute lymphocytes 20.1 (08/29/2023): WBC 25.4, 84% lymphocytes, absolute lymphocytes 21.2.   - CBC (10/18/2021 via PCP) with WBC 18.5 and ALC 12.4 - CBC (11/09/2021): WBC 16.5, lymphocytes 7.9, monocytes and eosinophils slightly elevated.  Normal Hgb and PLT.  LDH normal. - CBC (04/03/2022): WBC 15.4 with ALC 11.1.  LDH normal. - We have discussed normal pathophysiology of chronic lymphocytic leukemia.  As she is asymptomatic, recommend close observation.*** - We have discussed treatment indications which include B symptoms like fevers, night sweats and weight loss of more than 10% of body weight (unintentional), anemia, thrombocytopenia, neutropenia, painful splenomegaly, painful lymphadenopathy and recurrent infections.  We will do further testing in the form of CLL FISH panel, IGVH mutation, Tp53 mutation if there is any indication for treatment. - PLAN: Since she is overall stable, we will switch to every 82-month follow-up.  *** Alternate MD/APP visits ***   2.  Elevated blood pressure*** - Initial blood pressure today was 209/92. - Patient admits to feeling anxious about today's visit, and visibly tenses when her blood pressure is being checked. - Repeat blood pressure was 187/67. - She does not take any antihypertensive medications at home. - She denies any headache, vision changes, neurologic deficits, chest pain, shortness of breath. - PLAN: Since she is asymptomatic and blood pressure is  trending down, we will discharge her home, but with strict instructions on checking repeat blood pressure 30 to 60 minutes after she gets home and seeking additional medical attention if her blood pressure remains elevated or she develops any of the above symptoms.   3.  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 No tobacco or drugs.  Occasional alcohol (1 beer per month on average) - FAMILY: Son with MDS that transitioned to AML (in his 50s).  Maternal uncles x2 with unspecified leukemia.  PLAN SUMMARY: >> *** >> *** >> ***     REVIEW OF SYSTEMS: ***  Review of Systems - Oncology   PHYSICAL EXAM:  ECOG PERFORMANCE STATUS: {CHL ONC ECOG WU:[id number]} *** There were no vitals filed for this visit. There were no vitals filed for this visit. Physical Exam  PAST MEDICAL/SURGICAL HISTORY:  Past Medical History:  Diagnosis Date   BMI 31.0-31.9,adult    Chest tightness    CLL (chronic lymphocytic leukemia) (HCC)    Ear pain    HTN (hypertension)    Hyperlipidemia    Hypothyroid    Otalgia    SOB (shortness of breath)    No past surgical history on file.  SOCIAL HISTORY:  Social History   Socioeconomic History   Marital status: Divorced    Spouse name: Not on file   Number of children: Not on file   Years of education: Not on file   Highest education level: Not on file  Occupational History   Not on file  Tobacco Use   Smoking status: Never   Smokeless tobacco: Never  Vaping Use   Vaping status: Never Used  Substance and Sexual Activity   Alcohol use: Never   Drug use: Never   Sexual activity: Not on file  Other Topics Concern   Not on file  Social History Narrative   Not on file   Social Drivers of Health   Financial Resource Strain: Not on file  Food Insecurity: Not on file  Transportation Needs: Not on file  Physical Activity: Not on file  Stress: Not on file  Social Connections: Not on file  Intimate Partner Violence: Not on file    FAMILY HISTORY:  Family History  Problem Relation Age of Onset   Stroke Mother    Diabetes Mother    Heart attack Father    Diabetes Father     CURRENT MEDICATIONS:  Outpatient Encounter Medications as of 01/14/2024   Medication Sig   furosemide  (LASIX ) 20 MG tablet Take 1 tablet (20 mg total) by mouth daily as needed. For shortness of breath and leg swelling (Patient not taking: Reported on 10/17/2023)   losartan (COZAAR) 50 MG tablet Take 50 mg by mouth as needed (only takes occasionally).   metoprolol  tartrate (LOPRESSOR ) 25 MG tablet Take 1 tablet (25 mg total) by mouth 2 (two) times daily.   nitroGLYCERIN  (NITROSTAT ) 0.4 MG SL tablet Place 1 tablet (0.4 mg total) under the tongue every 5 (five) minutes x 3 doses as needed (if no relief after the 3rd dose please proceed to ED or call 911).   rosuvastatin (CRESTOR) 5 MG tablet Take 2.5 mg by mouth as directed. Takes maybe once every 2-3 months (Patient not taking: Reported on 10/17/2023)   SYNTHROID 25 MCG tablet Take 25 mcg by mouth daily.   No facility-administered encounter medications on file as of 01/14/2024.    ALLERGIES:  Allergies  Allergen Reactions  Augmentin [Amoxicillin-Pot Clavulanate] Nausea And Vomiting    LABORATORY DATA:  I have reviewed the labs as listed.  CBC    Component Value Date/Time   WBC 15.4 (H) 04/03/2022 1245   RBC 4.31 04/03/2022 1245   HGB 12.7 04/03/2022 1245   HCT 38.9 04/03/2022 1245   PLT 247 04/03/2022 1245   MCV 90.3 04/03/2022 1245   MCH 29.5 04/03/2022 1245   MCHC 32.6 04/03/2022 1245   RDW 13.7 04/03/2022 1245   LYMPHSABS 11.1 (H) 04/03/2022 1245   MONOABS 1.2 (H) 04/03/2022 1245   EOSABS 0.1 04/03/2022 1245   BASOSABS 0.1 04/03/2022 1245      Latest Ref Rng & Units 10/30/2023   11:45 AM 11/09/2021   10:22 AM 11/03/2009   12:00 PM  CMP  Glucose 70 - 99 mg/dL 98  161  096   BUN 8 - 27 mg/dL 14  12  7    Creatinine 0.57 - 1.00 mg/dL 0.45  4.09  8.11   Sodium 134 - 144 mmol/L 143  135  138   Potassium 3.5 - 5.2 mmol/L 4.6  3.7  3.8   Chloride 96 - 106 mmol/L 105  101  105   CO2 20 - 29 mmol/L 24  23  28    Calcium 8.7 - 10.3 mg/dL 9.4  9.2  9.1   Total Protein 6.5 - 8.1 g/dL  7.7  6.7   Total  Bilirubin 0.3 - 1.2 mg/dL  0.4  0.4   Alkaline Phos 38 - 126 U/L  57  61   AST 15 - 41 U/L  19  18   ALT 0 - 44 U/L  12  17     DIAGNOSTIC IMAGING:  I have independently reviewed the relevant imaging and discussed with the patient.   WRAP UP:  All questions were answered. The patient knows to call the clinic with any problems, questions or concerns.  Medical decision making: ***  Time spent on visit: I spent *** minutes counseling the patient face to face. The total time spent in the appointment was *** minutes and more than 50% was on counseling.  Sonnie Dusky, PA-C  ***

## 2024-01-14 ENCOUNTER — Inpatient Hospital Stay: Attending: Physician Assistant | Admitting: Physician Assistant

## 2024-01-14 VITALS — BP 164/95 | HR 87 | Temp 97.8°F | Resp 18 | Wt 168.9 lb

## 2024-01-14 DIAGNOSIS — K047 Periapical abscess without sinus: Secondary | ICD-10-CM | POA: Diagnosis not present

## 2024-01-14 DIAGNOSIS — C9110 Chronic lymphocytic leukemia of B-cell type not having achieved remission: Secondary | ICD-10-CM | POA: Insufficient documentation

## 2024-01-14 MED ORDER — CLINDAMYCIN HCL 150 MG PO CAPS: 150.0000 mg | ORAL_CAPSULE | Freq: Four times a day (QID) | ORAL | 0 refills | Status: AC

## 2024-01-14 NOTE — Patient Instructions (Signed)
 [facility] at [facility] Discharge Instructions   You were seen and examined today by Sheril Dines PA-C for your chronic lymphocytic leukemia (CLL).  CHRONIC LYMPHOCYTIC LEUKEMIA Your white blood cells have steadily increased over the past 2 years, but have been overall stable since January 2025. You do not have any lab or symptoms that meet criteria to start treatment for CLL at this time. You do not need any treatment for your CLL at this time.  Will continue to monitor you closely. Will check your labs and see you for follow-up visit again in 6 months. If you have any unexplained fever/chills, unintentional weight loss, or new lumps/bumps, please call our office so that we can schedule you for earlier follow-up.  DENTAL ABSCESS Prescription has been sent to your pharmacy for antibiotics (clindamycin) x 5 days. Seek immediate medical attention if you have any severe facial swelling, difficulty breathing, difficulty swallowing, or fever. It is EXTREMELY IMPORTANT that you address the root issue with your dentist and also follow-up on this with your primary care provider.   - - - - - - - - - - - - - - - - - -    Thank you for choosing [facility] at [facility] to provide your oncology and hematology care.  To afford each patient quality time with our provider, please arrive at least 15 minutes before your scheduled appointment time.   If you have a lab appointment with the Cancer Center please come in thru the Main Entrance and check in at the main information desk.  You need to re-schedule your appointment should you arrive 10 or more minutes late.  We strive to give you quality time with our providers, and arriving late affects you and other patients whose appointments are after yours.  Also, if you no show three or more times for appointments you may be dismissed from the clinic at the providers discretion.     Again, thank you for  choosing [facility].  Our hope is that these requests will decrease the amount of time that you wait before being seen by our physicians.       _____________________________________________________________  Should you have questions after your visit to [facility], please contact our office at [phone] and follow the prompts.  Our office hours are 8:00 a.m. and 4:30 p.m. Monday - Friday.  Please note that voicemails left after 4:00 p.m. may not be returned until the following business day.  We are closed weekends and major holidays.  You do have access to a nurse 24-7, just call the main number to the clinic [phone] and do not press any options, hold on the line and a nurse will answer the phone.    For prescription refill requests, have your pharmacy contact our office and allow 72 hours.    Due to Covid, you will need to wear a mask upon entering the hospital. If you do not have a mask, a mask will be given to you at the Main Entrance upon arrival. For doctor visits, patients may have 1 support person age 94 or older with them. For treatment visits, patients can not have anyone with them due to social distancing guidelines and our immunocompromised population.

## 2024-01-24 ENCOUNTER — Encounter: Payer: Self-pay | Admitting: Internal Medicine

## 2024-01-24 ENCOUNTER — Ambulatory Visit: Payer: No Typology Code available for payment source | Attending: Internal Medicine | Admitting: Internal Medicine

## 2024-01-24 VITALS — BP 152/90 | HR 89 | Ht 60.0 in | Wt 170.0 lb

## 2024-01-24 DIAGNOSIS — R079 Chest pain, unspecified: Secondary | ICD-10-CM | POA: Diagnosis not present

## 2024-01-24 DIAGNOSIS — E7849 Other hyperlipidemia: Secondary | ICD-10-CM | POA: Diagnosis not present

## 2024-01-24 DIAGNOSIS — I10 Essential (primary) hypertension: Secondary | ICD-10-CM | POA: Diagnosis not present

## 2024-01-24 NOTE — Patient Instructions (Addendum)
 Medication Instructions:  Your physician recommends that you continue on your current medications as directed. Please refer to the Current Medication list given to you today.   Labwork: None  Testing/Procedures: None  Follow-Up: Your physician recommends that you schedule a follow-up appointment in: 1 year. You will receive a reminder call in about 8 months reminding you to schedule your appointment. If you don't receive this call, please contact our office.   Any Other Special Instructions Will Be Listed Below (If Applicable). PCSK9i Inclisiran injection   Thank you for choosing  [facility]!     If you need a refill on your cardiac medications before your next appointment, please call your pharmacy.

## 2024-01-24 NOTE — Progress Notes (Signed)
 Cardiology Office Note  Date: 01/24/2024   ID: Tracy Curtis, DOB 07-26-1941, MRN [id number]  PCP:  Alston Jerry, MD  Cardiologist:  Lasalle Pointer, MD Electrophysiologist:  None    History of Present Illness: Tracy Curtis is a 83 y.o. female known to have HTN, HLD, CLL is here for follow-up visit.  Initially referred to cardiology clinic for evaluation of chest pains for which she underwent CT cardiac. CT cardiac showed coronary calcium score of 196, 60th percentile for age and sex matched control.  Mild nonobstructive CAD however the accuracy was limited due to presence of breathing and misregistration artifact.  She is here for follow-up visit.  Chest pains improved according to the patient.  Not on metoprolol  that was started in the last clinic visit for chest pains.  She still continues to have chest pains 2 times per week, exertional and last for few minutes.  Does not have any other symptoms of DOE, dizziness, syncope, leg swelling or palpitations.  She is hesitant to be on statins as his family members did not tolerate statins very well.  Echo from August 2024 showed normal LVEF, G1 DD with normal LVEDP, mild MR.   Past Medical History:  Diagnosis Date   BMI 31.0-31.9,adult    Chest tightness    CLL (chronic lymphocytic leukemia) (HCC)    Ear pain    HTN (hypertension)    Hyperlipidemia    Hypothyroid    Otalgia    SOB (shortness of breath)     Current Outpatient Medications  Medication Sig Dispense Refill   furosemide  (LASIX ) 20 MG tablet Take 1 tablet (20 mg total) by mouth daily as needed. For shortness of breath and leg swelling 30 tablet 2   losartan (COZAAR) 50 MG tablet Take 50 mg by mouth as needed (only takes occasionally).     metoprolol  tartrate (LOPRESSOR ) 25 MG tablet Take 1 tablet (25 mg total) by mouth 2 (two) times daily. (Patient not taking: Reported on 01/14/2024) 60 tablet 5   nitroGLYCERIN  (NITROSTAT ) 0.4 MG SL tablet Place 1 tablet (0.4 mg total)  under the tongue every 5 (five) minutes x 3 doses as needed (if no relief after the 3rd dose please proceed to ED or call 911). (Patient not taking: Reported on 01/14/2024) 25 tablet 2   rosuvastatin (CRESTOR) 5 MG tablet Take 2.5 mg by mouth as directed. Takes maybe once every 2-3 months     SYNTHROID 25 MCG tablet Take 25 mcg by mouth daily.     No current facility-administered medications for this visit.   Allergies:  Augmentin [amoxicillin-pot clavulanate]   Social History: The patient  reports that she has never smoked. She has never used smokeless tobacco. She reports that she does not drink alcohol and does not use drugs.   Family History: The patient's family history includes Diabetes in her father and mother; Heart attack in her father; Stroke in her mother.   ROS:  Please see the history of present illness. Otherwise, complete review of systems is positive for none  All other systems are reviewed and negative.   Physical Exam: VS:  There were no vitals taken for this visit., BMI There is no height or weight on file to calculate BMI.  Wt Readings from Last 3 Encounters:  01/14/24 168 lb 14 oz (76.6 kg)  10/17/23 170 lb 6.4 oz (77.3 kg)  04/12/23 168 lb (76.2 kg)    General: Patient appears comfortable at rest. HEENT: Conjunctiva and  lids normal, oropharynx clear with moist mucosa. Neck: Supple, no elevated JVP or carotid bruits, no thyromegaly. Lungs: Clear to auscultation, nonlabored breathing at rest. Cardiac: Regular rate and rhythm, no S3 or significant systolic murmur, no pericardial rub. Abdomen: Soft, nontender, no hepatomegaly, bowel sounds present, no guarding or rebound. Extremities: No pitting edema, distal pulses 2+. Skin: Warm and dry. Musculoskeletal: No kyphosis. Neuropsychiatric: Alert and oriented x3, affect grossly appropriate.  Recent Labwork: 10/30/2023: BUN 14; Creatinine, Ser 0.93; Potassium 4.6; Sodium 143  No results found for: "CHOL", "TRIG", "HDL",  "CHOLHDL", "VLDL", "LDLCALC", "LDLDIRECT"   Assessment and Plan:   Cardiac chest pain: Continues to have exertional chest pains, 2 times per week and last for few minutes but significantly improved after the last clinic visit.  Currently not on any metoprolol  that was started recently, still chest pain improved.  Unclear if these chest pains are related to coronary microvascular dysfunction.  CT cardiac showed coronary calcium score of 196, 60th percentile for age and sex matched control.  Mild nonobstructive CAD however the accuracy was limited due to presence of breathing and misregistration artifact.  Currently on rosuvastatin 2.5 mg once in 2 to 3 months.  LDL 158 in December 2024.  Goal LDL should be less than 100.  Discussed about initiating PCSK9 inhibitors or inclisiran.  Patient seem to be hesitant.  She will research about these and give us  a call when she is ready to be started on the or inclisiran.  HTN, controlled: Continue losartan 50 mg once daily, follow-up with PCP.  HLD, not at goal currently on rosuvastatin 2.5 mg once in 2 to 3 months.  LDL 158 in December 2024.  Goal LDL should be less than 100.  Discussed about initiating PCSK9 inhibitors or inclisiran.  Patient seem to be hesitant.  She will research about these and give us  a call when she is ready to be started on the or inclisiran.  Small PFO: No workup needed.    Medication Adjustments/Labs and Tests Ordered: Current medicines are reviewed at length with the patient today.  Concerns regarding medicines are outlined above.   Tests Ordered: No orders of the defined types were placed in this encounter.   Medication Changes: No orders of the defined types were placed in this encounter.   Disposition:  Follow up 1 year  Signed Taiwo Fish Beauford Bounds, MD, 01/24/2024 11:28 AM    [facility] at [facility] [address] [place], [place], [place] [address]

## 2024-02-07 ENCOUNTER — Ambulatory Visit: Admitting: Physician Assistant

## 2024-02-13 DIAGNOSIS — Z6832 Body mass index (BMI) 32.0-32.9, adult: Secondary | ICD-10-CM | POA: Diagnosis not present

## 2024-02-13 DIAGNOSIS — E782 Mixed hyperlipidemia: Secondary | ICD-10-CM | POA: Diagnosis not present

## 2024-02-13 DIAGNOSIS — E7849 Other hyperlipidemia: Secondary | ICD-10-CM | POA: Diagnosis not present

## 2024-02-13 DIAGNOSIS — R739 Hyperglycemia, unspecified: Secondary | ICD-10-CM | POA: Diagnosis not present

## 2024-02-13 DIAGNOSIS — E039 Hypothyroidism, unspecified: Secondary | ICD-10-CM | POA: Diagnosis not present

## 2024-02-13 DIAGNOSIS — C9110 Chronic lymphocytic leukemia of B-cell type not having achieved remission: Secondary | ICD-10-CM | POA: Diagnosis not present

## 2024-03-10 DIAGNOSIS — Z6832 Body mass index (BMI) 32.0-32.9, adult: Secondary | ICD-10-CM | POA: Diagnosis not present

## 2024-03-10 DIAGNOSIS — R29898 Other symptoms and signs involving the musculoskeletal system: Secondary | ICD-10-CM | POA: Diagnosis not present

## 2024-03-10 DIAGNOSIS — M545 Low back pain, unspecified: Secondary | ICD-10-CM | POA: Diagnosis not present

## 2024-03-10 DIAGNOSIS — S9031XA Contusion of right foot, initial encounter: Secondary | ICD-10-CM | POA: Diagnosis not present

## 2024-03-10 DIAGNOSIS — C9110 Chronic lymphocytic leukemia of B-cell type not having achieved remission: Secondary | ICD-10-CM | POA: Diagnosis not present

## 2024-03-11 ENCOUNTER — Ambulatory Visit: Admitting: Physician Assistant

## 2024-03-12 ENCOUNTER — Other Ambulatory Visit: Payer: Self-pay | Admitting: Family Medicine

## 2024-03-12 DIAGNOSIS — M545 Low back pain, unspecified: Secondary | ICD-10-CM

## 2024-03-20 ENCOUNTER — Ambulatory Visit: Admitting: Orthopaedic Surgery

## 2024-03-20 ENCOUNTER — Telehealth: Payer: Self-pay | Admitting: Radiology

## 2024-03-20 ENCOUNTER — Ambulatory Visit: Admitting: Orthopedic Surgery

## 2024-03-20 ENCOUNTER — Encounter: Payer: Self-pay | Admitting: Orthopaedic Surgery

## 2024-03-20 VITALS — BP 176/110 | HR 118 | Ht 60.0 in | Wt 166.0 lb

## 2024-03-20 DIAGNOSIS — S92151A Displaced avulsion fracture (chip fracture) of right talus, initial encounter for closed fracture: Secondary | ICD-10-CM

## 2024-03-20 MED ORDER — TRAMADOL HCL 50 MG PO TABS: 50.0000 mg | ORAL_TABLET | Freq: Four times a day (QID) | ORAL | 2 refills | Status: AC | PRN

## 2024-03-20 NOTE — Progress Notes (Signed)
 Subjective:    Patient ID: Lionel Free, female    DOB: 1941-03-21, 83 y.o.   MRN: [id number]  HPI Patient was scheduled to be seen in the [place] office this morning by me.  She was considered a no show after not appearing long after her appointment.  She however was trying to find the new [place] office. She was sent to various locations around [place]. When she did show, I had already left the office and it was near lunch time.  I was notified.  I offered to see her in [place].  Patient agreed to be seen in [place] but she could not come until 3:00 pm.  The patient fell at home on Tuesday July 1 and hurt her right ankle and foot.  She had swelling and pain.  She tried ice and elevation and Tylenol but it continued to hurt.  She saw her primary care at [facility] in [place] (we are immediately adjacent to them in [place] in their building) yesterday.  X-rays were done.  She was told she had a contusion to the foot.  She had no other injuries.  I have reviewed the notes from [facility] and also the X-rays.  I have independently reviewed and interpreted x-rays of this patient done at another site by another physician or qualified health professional.  X-rays show an avulsion fracture of the talus on the right anteriorly.  Mortise is normal, no other fracture seen.   Review of Systems  Constitutional:  Positive for activity change.  Musculoskeletal:  Positive for arthralgias, gait problem, joint swelling and myalgias.  She also has some shoulder and neck pain at times.  For Review of Systems, all other systems reviewed and are negative.  The following is a summary of the past history medically, past history surgically, known current medicines, social history and family history.  This information is gathered electronically by the computer from prior information and documentation.  I review this each visit and have found including this information at this point in the chart is beneficial and informative.    Past Medical History:  Diagnosis Date   BMI 31.0-31.9,adult    Chest tightness    CLL (chronic lymphocytic leukemia) (HCC)    Ear pain    HTN (hypertension)    Hyperlipidemia    Hypothyroid    Otalgia    SOB (shortness of breath)     History reviewed. No pertinent surgical history.  Current Outpatient Medications on File Prior to Visit  Medication Sig Dispense Refill   furosemide  (LASIX ) 20 MG tablet Take 1 tablet (20 mg total) by mouth daily as needed. For shortness of breath and leg swelling 30 tablet 2   losartan (COZAAR) 50 MG tablet Take 50 mg by mouth as needed (only takes occasionally).     metoprolol  tartrate (LOPRESSOR ) 25 MG tablet Take 1 tablet (25 mg total) by mouth 2 (two) times daily. (Patient not taking: Reported on 01/24/2024) 60 tablet 5   nitroGLYCERIN  (NITROSTAT ) 0.4 MG SL tablet Place 1 tablet (0.4 mg total) under the tongue every 5 (five) minutes x 3 doses as needed (if no relief after the 3rd dose please proceed to ED or call 911). 25 tablet 2   rosuvastatin (CRESTOR) 5 MG tablet Take 2.5 mg by mouth as directed. Takes maybe once every 2-3 months     SYNTHROID 25 MCG tablet Take 25 mcg by mouth daily.     No current facility-administered medications on file prior to visit.  Social History   Socioeconomic History   Marital status: Divorced    Spouse name: Not on file   Number of children: Not on file   Years of education: Not on file   Highest education level: Not on file  Occupational History   Not on file  Tobacco Use   Smoking status: Never   Smokeless tobacco: Never  Vaping Use   Vaping status: Never Used  Substance and Sexual Activity   Alcohol use: Never   Drug use: Never   Sexual activity: Not on file  Other Topics Concern   Not on file  Social History Narrative   Not on file   Social Drivers of Health   Financial Resource Strain: Not on file  Food Insecurity: Not on file  Transportation Needs: Not on file  Physical Activity:  Not on file  Stress: Not on file  Social Connections: Not on file  Intimate Partner Violence: Not on file    Family History  Problem Relation Age of Onset   Stroke Mother    Diabetes Mother    Heart attack Father    Diabetes Father     BP (!) 176/110   Pulse (!) 118   Ht 5' (1.524 m)   Wt 166 lb (75.3 kg)   BMI 32.42 kg/m   Body mass index is 32.42 kg/m.      Objective:   Physical Exam Vitals and nursing note reviewed. Exam conducted with a chaperone present.  Constitutional:      Appearance: She is well-developed.  HENT:     Head: Normocephalic and atraumatic.  Eyes:     Conjunctiva/sclera: Conjunctivae normal.     Pupils: Pupils are equal, round, and reactive to light.  Cardiovascular:     Rate and Rhythm: Normal rate and regular rhythm.  Pulmonary:     Effort: Pulmonary effort is normal.  Abdominal:     Palpations: Abdomen is soft.  Musculoskeletal:     Cervical back: Normal range of motion and neck supple.       Feet:  Skin:    General: Skin is warm and dry.  Neurological:     Mental Status: She is alert and oriented to person, place, and time.     Cranial Nerves: No cranial nerve deficit.     Motor: No abnormal muscle tone.     Coordination: Coordination normal.     Deep Tendon Reflexes: Reflexes are normal and symmetric. Reflexes normal.  Psychiatric:        Behavior: Behavior normal.        Thought Content: Thought content normal.        Judgment: Judgment normal.           Assessment & Plan:   Encounter Diagnosis  Name Primary?   Closed displaced avulsion fracture of right talus, initial encounter Yes   I have explained the findings to her.  I have given a CAM walker and explained how to use.  I have given instructions for contrast baths.  Use Aspercreme, BioFreeze or Voltaren Gel to the affected areas of the foot and shoulder as needed.  She is planning to go to either [place], [place] or [place]  next week for two weeks.    I will  see her, therefore, in three weeks in the [place] office.  X-rays then of the right ankle.  I have reviewed the [place]  Controlled Substance Reporting System web site prior to prescribing narcotic medicine for this patient.  I will  give Ultram  for pain as needed.  Call if any problem.  Precautions discussed.  Electronically Signed Lemond Stable, MD 7/10/20253:38 PM

## 2024-03-20 NOTE — Telephone Encounter (Signed)
 Patient came in late for appointment scheduled with Dr. Brenna. Unfortunately, she was told that our office was across from the [employer] and went there first.  She was told by someone there that we were located in an office behind [employer] and she traveled to [facility] next.  From there, she was sent to our facility, however, due to no signage for [facility], she did not know what office we were in. She then went down to the far end of the building, where they routed her back up to our office. She has a swollen ankle/foot and has had to park and walk in to each of these areas which has made it much worse. She also keeps her grandchildren and had to have plans made for them today for her to attend this appointment.  Dr. Brenna stayed in the office until a little after 1030.  Patient is extremely upset and is unable to reschedule to next week as she is going on a scheduled vacation.  I offered appointment in [place] today, however, patient is unable to drive that far. I spoke with Sari who has worked her in to see Dr. Margrette this afternoon in [place]. I did explain to patient that she can be seen in the [place] office in the future if that is what works better for her.  Patient was given my name, as well as the [facility] office phone number, in case she needs anything further.

## 2024-03-20 NOTE — Patient Instructions (Signed)
Aspercreme, Biofreeze, Blue Emu or Voltaren Gel over the counter 2-3 times daily. Rub into area well each use for best results.  

## 2024-04-02 ENCOUNTER — Other Ambulatory Visit

## 2024-04-03 ENCOUNTER — Telehealth: Payer: Self-pay | Admitting: *Deleted

## 2024-04-03 NOTE — Telephone Encounter (Signed)
 Pt states she do not want to see Dr Brenna as of last Thursday due to being late and he couldn't wait for her due to she was giving wrong information. She states she feels like her wishes were not honored when she stated she did not want to see him and he came in anyway when her appointment was with another Dr. In [place]. She felt like she had no rights. She is very unhappy. No F/up and to find out she had a fracture. He would not do xray.

## 2024-04-08 ENCOUNTER — Other Ambulatory Visit (HOSPITAL_COMMUNITY): Payer: Self-pay | Admitting: Family Medicine

## 2024-04-08 DIAGNOSIS — M810 Age-related osteoporosis without current pathological fracture: Secondary | ICD-10-CM

## 2024-04-09 ENCOUNTER — Encounter: Payer: Self-pay | Admitting: Surgical

## 2024-04-09 ENCOUNTER — Ambulatory Visit (INDEPENDENT_AMBULATORY_CARE_PROVIDER_SITE_OTHER): Admitting: Surgical

## 2024-04-09 DIAGNOSIS — M79671 Pain in right foot: Secondary | ICD-10-CM

## 2024-04-09 DIAGNOSIS — G8929 Other chronic pain: Secondary | ICD-10-CM

## 2024-04-09 DIAGNOSIS — M25511 Pain in right shoulder: Secondary | ICD-10-CM

## 2024-04-09 DIAGNOSIS — S92151A Displaced avulsion fracture (chip fracture) of right talus, initial encounter for closed fracture: Secondary | ICD-10-CM

## 2024-04-09 NOTE — Progress Notes (Signed)
 Post-fracture visit Note   Patient: Tracy Curtis           Date of Birth: 1940/11/14           MRN: [id number] Visit Date: 04/09/2024 PCP: Lari Elspeth BRAVO, MD   Assessment & Plan:  Chief Complaint:  Chief Complaint  Patient presents with   Right Ankle - Follow-up    Fall 03/11/2024   Visit Diagnoses:  1. Closed displaced avulsion fracture of right talus, initial encounter   2. Pain in right foot   3. Chronic right shoulder pain     Plan: Patient is an 83 year old female who presents s/p avulsion fracture of the talus sustained about 1 month ago.  She has previously seen Dr. Brenna but has requested a different provider.  She reports ambulating in a cam walker boot over the last month.  At this point, she is ambulating without any substantial discomfort.  She ambulates outside of the boot when she goes to the bathroom and this does not cause any recurrence of her ankle pain.  Using a compression sock for her right leg.  She ambulates without cane or walker.  No instability of the ankle.  No recent fall in the last month.  She also describes primarily right shoulder pain that has been increased since her fall.  She describes lateral shoulder pain with some pain in the scapular and trapezius region.  This pain will radiate to the mid humeral region with no radiation past.  She has no history of prior surgery to the shoulder or neck.  Denies any weakness or mechanical symptoms.  Pain will occasionally wake her up from sleep at night about 1-2 nights out of the week.  She uses Aspercreme topically with good relief of her shoulder pain.  On exam, right ankle has palpable DP pulse and intact ankle dorsiflexion, plantarflexion, inversion, eversion.  Terminal inversion does somewhat reproduce her anterolateral ankle pain which she describes as mild.  She has no cellulitis or skin changes noted around the ankle.  She has minimal anterior lateral swelling of the ankle compared with contralateral  side.  No pitting edema.  No tenderness over the Lisfranc complex, fifth metatarsal base, Achilles tendon, lateral malleolus, medial malleolus.  She has some mild tenderness over the talus anteriorly.  Regarding her shoulder exam, she has 60 degrees X rotation, 80 degrees abduction, 140 degrees forward elevation passively and actively.  She has intact rotator cuff strength of supra, infra, subscap rated 5/5.  Axillary nerve intact with deltoid firing.  Intact EPL, FPL, finger abduction, pronation/supination, bicep, tricep, deltoid.  She has mild tenderness over the [facility] joint and really no tenderness over the bicipital groove.  There is no deformity noted to the shoulder region.  No tenderness over the acromion.  No pain with cervical spine range of motion.  Radiographs taken today of the right ankle/foot demonstrate no new fracture or dislocation.  Really no significant change compared with prior radiographs.  Avulsion fragment seems stable.  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  Plan at this time is transition from boot to ASO with regular shoe.  She will use this as well as perform range of motion PT exercises at home and okay to weight-bear as tolerated.  Follow-up in 4 weeks for likely final check and release.  Follow-Up Instructions: No follow-ups on  file.   Orders:  Orders Placed This Encounter  Procedures   DG Ankle Complete Right   DG Foot Complete Right   DG Shoulder Right   No orders of the defined types were placed in this encounter.   Imaging: No results found.  PMFS History: Patient Active Problem List   Diagnosis Date Noted   Cardiac chest pain 10/17/2023   ERRONEOUS ENCOUNTER--DISREGARD 04/23/2023   HTN (hypertension) 04/12/2023   HLD (hyperlipidemia) 04/12/2023   SOB (shortness of breath) 04/12/2023    CLL (chronic lymphocytic leukemia) (HCC) 11/30/2021   Leukocytosis 11/09/2021   Past Medical History:  Diagnosis Date   BMI 31.0-31.9,adult    Chest tightness    CLL (chronic lymphocytic leukemia) (HCC)    Ear pain    HTN (hypertension)    Hyperlipidemia    Hypothyroid    Otalgia    SOB (shortness of breath)     Family History  Problem Relation Age of Onset   Stroke Mother    Diabetes Mother    Heart attack Father    Diabetes Father     No past surgical history on file. Social History   Occupational History   Not on file  Tobacco Use   Smoking status: Never   Smokeless tobacco: Never  Vaping Use   Vaping status: Never Used  Substance and Sexual Activity   Alcohol use: Never   Drug use: Never   Sexual activity: Not on file

## 2024-04-10 ENCOUNTER — Ambulatory Visit: Admitting: Orthopaedic Surgery

## 2024-04-10 ENCOUNTER — Ambulatory Visit
Admission: RE | Admit: 2024-04-10 | Discharge: 2024-04-10 | Disposition: A | Discharge status: Home / Self Care | Source: Ambulatory Visit | Attending: Family Medicine | Admitting: Family Medicine

## 2024-04-10 DIAGNOSIS — M5440 Lumbago with sciatica, unspecified side: Secondary | ICD-10-CM | POA: Diagnosis not present

## 2024-04-10 DIAGNOSIS — M4316 Spondylolisthesis, lumbar region: Secondary | ICD-10-CM | POA: Diagnosis not present

## 2024-04-10 DIAGNOSIS — M48061 Spinal stenosis, lumbar region without neurogenic claudication: Secondary | ICD-10-CM | POA: Diagnosis not present

## 2024-04-10 DIAGNOSIS — M545 Low back pain, unspecified: Secondary | ICD-10-CM

## 2024-04-10 DIAGNOSIS — M4696 Unspecified inflammatory spondylopathy, lumbar region: Secondary | ICD-10-CM | POA: Diagnosis not present

## 2024-04-15 ENCOUNTER — Encounter: Payer: Self-pay | Admitting: Physician Assistant

## 2024-04-15 ENCOUNTER — Ambulatory Visit: Admitting: Physician Assistant

## 2024-04-15 DIAGNOSIS — D0430 Carcinoma in situ of skin of unspecified part of face: Secondary | ICD-10-CM

## 2024-04-15 DIAGNOSIS — D489 Neoplasm of uncertain behavior, unspecified: Secondary | ICD-10-CM | POA: Diagnosis not present

## 2024-04-15 DIAGNOSIS — D1801 Hemangioma of skin and subcutaneous tissue: Secondary | ICD-10-CM | POA: Diagnosis not present

## 2024-04-15 DIAGNOSIS — L720 Epidermal cyst: Secondary | ICD-10-CM

## 2024-04-15 DIAGNOSIS — Z1283 Encounter for screening for malignant neoplasm of skin: Secondary | ICD-10-CM

## 2024-04-15 DIAGNOSIS — W908XXA Exposure to other nonionizing radiation, initial encounter: Secondary | ICD-10-CM | POA: Diagnosis not present

## 2024-04-15 DIAGNOSIS — L570 Actinic keratosis: Secondary | ICD-10-CM | POA: Diagnosis not present

## 2024-04-15 DIAGNOSIS — L821 Other seborrheic keratosis: Secondary | ICD-10-CM

## 2024-04-15 DIAGNOSIS — L814 Other melanin hyperpigmentation: Secondary | ICD-10-CM

## 2024-04-15 DIAGNOSIS — C4492 Squamous cell carcinoma of skin, unspecified: Secondary | ICD-10-CM

## 2024-04-15 DIAGNOSIS — L578 Other skin changes due to chronic exposure to nonionizing radiation: Secondary | ICD-10-CM | POA: Diagnosis not present

## 2024-04-15 DIAGNOSIS — L820 Inflamed seborrheic keratosis: Secondary | ICD-10-CM | POA: Diagnosis not present

## 2024-04-15 HISTORY — DX: Squamous cell carcinoma of skin, unspecified: C44.92

## 2024-04-15 NOTE — Patient Instructions (Signed)
 SABRA

## 2024-04-15 NOTE — Progress Notes (Signed)
 New Patient Visit   Subjective  Tracy Curtis is a 83 y.o. female who presents for the following: Skin Cancer Screening and Full Body Skin Exam. She has NEVER seen dermatology.   Tracy Curtis is here for a FBSE. There were many family members that had abnormal moles but unsure of the dx. Spots of concern are dark spots on hands. This is her first skin check.   The patient presents for Total-Body Skin Exam (TBSE) for skin cancer screening and mole check. The patient has spots, moles and lesions to be evaluated, some may be new or changing and the patient may have concern these could be cancer.    The following portions of the chart were reviewed this encounter and updated as appropriate: medications, allergies, medical history  Review of Systems:  No other skin or systemic complaints except as noted in HPI or Assessment and Plan.  Objective  Well appearing patient in no apparent distress; mood and affect are within normal limits.  A full examination was performed including scalp, head, eyes, ears, nose, lips, neck, chest, axillae, abdomen, back, buttocks, bilateral upper extremities, bilateral lower extremities, hands, feet, fingers, toes, fingernails, and toenails. All findings within normal limits unless otherwise noted below.   Relevant physical exam findings are noted in the Assessment and Plan.           A:Right Zygomatic Area 1.1 cm erythematous scaly plaque  B:Left Malar Cheek 1.8cm erythematous scaly plaque  Nose, Right Melolabial Fold (3) Erythematous thin papules/macules with gritty scale.   Assessment & Plan   SKIN CANCER SCREENING PERFORMED TODAY.  ACTINIC DAMAGE - Chronic condition, secondary to cumulative UV/sun exposure - diffuse scaly erythematous macules with underlying dyspigmentation - Recommend daily broad spectrum sunscreen SPF 30+ to sun-exposed areas, reapply every 2 hours as needed.  - Staying in the shade or wearing long sleeves, sun glasses (UVA+UVB  protection) and wide brim hats (4-inch brim around the entire circumference of the hat) are also recommended for sun protection.  - Call for new or changing lesions.  LENTIGINES, SEBORRHEIC KERATOSES, HEMANGIOMAS - Benign normal skin lesions - Benign-appearing - Call for any changes  MELANOCYTIC NEVI - Tan-brown and/or pink-flesh-colored symmetric macules and papules - Benign appearing on exam today - Observation - Call clinic for new or changing moles - Recommend daily use of broad spectrum spf 30+ sunscreen to sun-exposed areas.        NEOPLASM OF UNCERTAIN BEHAVIOR (2) A:Right Zygomatic Area Skin / nail biopsy Type of biopsy: tangential   Informed consent: discussed and consent obtained   Timeout: patient name, date of birth, surgical site, and procedure verified   Procedure prep:  Patient was prepped and draped in usual sterile fashion Prep type:  Isopropyl alcohol Anesthesia: the lesion was anesthetized in a standard fashion   Anesthetic:  1% lidocaine w/ epinephrine 1-100,000 buffered w/ 8.4% NaHCO3 Instrument used: DermaBlade   Hemostasis achieved with: aluminum chloride   Outcome: patient tolerated procedure well   Post-procedure details: sterile dressing applied and wound care instructions given   Dressing type: petrolatum gauze and bandage    Specimen 1 - Surgical pathology Differential Diagnosis: Rule our SCC  Check Margins: No B:Left Malar Cheek Skin / nail biopsy Type of biopsy: tangential   Informed consent: discussed and consent obtained   Timeout: patient name, date of birth, surgical site, and procedure verified   Procedure prep:  Patient was prepped and draped in usual sterile fashion Prep type:  Isopropyl alcohol Anesthesia:  the lesion was anesthetized in a standard fashion   Anesthetic:  1% lidocaine w/ epinephrine 1-100,000 buffered w/ 8.4% NaHCO3 Instrument used: DermaBlade   Hemostasis achieved with: aluminum chloride   Outcome: patient  tolerated procedure well   Post-procedure details: sterile dressing applied and wound care instructions given   Dressing type: petrolatum gauze and bandage    Specimen 2 - Surgical pathology Differential Diagnosis: Rule out SCC  Check Margins: No AK (ACTINIC KERATOSIS) (4) Nose, Right Melolabial Fold (3) Destruction of lesion - Nose Complexity: simple   Destruction method: cryotherapy   Informed consent: discussed and consent obtained   Timeout:  patient name, date of birth, surgical site, and procedure verified Lesion destroyed using liquid nitrogen: Yes   Region frozen until ice ball extended beyond lesion: Yes   Outcome: patient tolerated procedure well with no complications   Post-procedure details: wound care instructions given    SCREENING EXAM FOR SKIN CANCER   ACTINIC SKIN DAMAGE   LENTIGINES   SEBORRHEIC KERATOSIS   CHERRY ANGIOMA   PLAN TOPICAL EFUDEX ON FOLLOW UP TO NOSE AND RIGHT UPPER CUTANEOUS LIP AREA. I SUSPECT SHE WILL BE BACK FOR TREATMENT OF 2 BIOPSIES WE PERFORMED TODAY.    Return in about 4 months (around 08/15/2024) for AK f/u (start Efudex possibly) .  I, Gordan Beams, CMA, am acting as scribe for Nadim Malia K, PA-C.   Documentation: I have reviewed the above documentation for accuracy and completeness, and I agree with the above.  Tjuana Vickrey K, PA-C

## 2024-04-18 LAB — SURGICAL PATHOLOGY

## 2024-04-29 ENCOUNTER — Ambulatory Visit: Payer: Self-pay | Admitting: Physician Assistant

## 2024-04-30 DIAGNOSIS — Z6831 Body mass index (BMI) 31.0-31.9, adult: Secondary | ICD-10-CM | POA: Diagnosis not present

## 2024-04-30 DIAGNOSIS — Z008 Encounter for other general examination: Secondary | ICD-10-CM | POA: Diagnosis not present

## 2024-04-30 DIAGNOSIS — N1831 Chronic kidney disease, stage 3a: Secondary | ICD-10-CM | POA: Diagnosis not present

## 2024-04-30 DIAGNOSIS — I2510 Atherosclerotic heart disease of native coronary artery without angina pectoris: Secondary | ICD-10-CM | POA: Diagnosis not present

## 2024-04-30 DIAGNOSIS — E669 Obesity, unspecified: Secondary | ICD-10-CM | POA: Diagnosis not present

## 2024-05-01 ENCOUNTER — Encounter: Payer: Self-pay | Admitting: Physician Assistant

## 2024-05-01 NOTE — Telephone Encounter (Signed)
-----   Message from [facility] K sent at 04/29/2024  6:17 PM EDT ----- Hypertrophic AK - f/u visit - Efudex 3 weeks +/-  cryo  SCCis - f/u visit (as above) to discuss cryo + /- efudex  I will go over all this at her visit - treatment options and path report  Can add on at the 4:15 appt please  ----- Message ----- From: Interface, [facility] Sent: 04/18/2024   5:12 PM EDT To: Erminio MARLA Like, PA-C

## 2024-05-01 NOTE — Telephone Encounter (Signed)
 Advised patient of results and scheduled her for follow up appointment 05/05/2024 at 12:30/hd

## 2024-05-02 ENCOUNTER — Encounter: Payer: Self-pay | Admitting: Radiology

## 2024-05-05 ENCOUNTER — Ambulatory Visit (INDEPENDENT_AMBULATORY_CARE_PROVIDER_SITE_OTHER): Admitting: Physician Assistant

## 2024-05-05 ENCOUNTER — Encounter: Payer: Self-pay | Admitting: Physician Assistant

## 2024-05-05 VITALS — BP 145/83

## 2024-05-05 DIAGNOSIS — Z5111 Encounter for antineoplastic chemotherapy: Secondary | ICD-10-CM | POA: Diagnosis not present

## 2024-05-05 DIAGNOSIS — L570 Actinic keratosis: Secondary | ICD-10-CM | POA: Diagnosis not present

## 2024-05-05 DIAGNOSIS — D0439 Carcinoma in situ of skin of other parts of face: Secondary | ICD-10-CM

## 2024-05-05 DIAGNOSIS — D099 Carcinoma in situ, unspecified: Secondary | ICD-10-CM

## 2024-05-05 MED ORDER — FLUOROURACIL 5 % EX CREA: TOPICAL_CREAM | Freq: Two times a day (BID) | CUTANEOUS | 1 refills | Status: AC

## 2024-05-05 MED ORDER — FLUOROURACIL 5 % EX CREA
TOPICAL_CREAM | Freq: Two times a day (BID) | CUTANEOUS | 1 refills | Status: DC
Start: 2024-05-05 — End: 2024-05-05

## 2024-05-05 NOTE — Progress Notes (Signed)
   Follow-Up Visit   Subjective  Tracy Curtis is a 83 y.o. female who presents for the following: FOLLOW UP to go over biopsy results. Pt states she does have a question about a place on her right temple that itch's sometimes. Pt would like to have recommendations on body wash and face wash.   The following portions of the chart were reviewed this encounter and updated as appropriate: medications, allergies, medical history  Review of Systems:  No other skin or systemic complaints except as noted in HPI or Assessment and Plan.  Objective  Well appearing patient in no apparent distress; mood and affect are within normal limits.   A focused examination was performed of the following areas: Face  Relevant exam findings are noted in the Assessment and Plan.    Assessment & Plan  ACTINIC KERATOSIS/ biopsy proven SCCIS Exam: Erythematous thin papules/macules with gritty scale on face and healing biopsy sites.   Apply Fluorouracil  as directed  Apply to right cheek twice daily x 2 weeks (and other areas of AK's on face)  Apply to left cheek twice daily x 6 weeks  AK (ACTINIC KERATOSIS)   SQUAMOUS CELL CARCINOMA IN SITU (SCCIS)    Return in about 3 months (around 08/05/2024) for f/u Efudex  .  I, Doyce Pan, CMA, am acting as scribe for [employer], PA-C.   Documentation: I have reviewed the above documentation for accuracy and completeness, and I agree with the above.  Jonathon Castelo K, PA-C

## 2024-05-05 NOTE — Patient Instructions (Signed)

## 2024-05-06 ENCOUNTER — Other Ambulatory Visit (HOSPITAL_BASED_OUTPATIENT_CLINIC_OR_DEPARTMENT_OTHER)

## 2024-05-07 ENCOUNTER — Ambulatory Visit (INDEPENDENT_AMBULATORY_CARE_PROVIDER_SITE_OTHER): Admitting: Surgical

## 2024-05-07 ENCOUNTER — Other Ambulatory Visit: Payer: Self-pay

## 2024-05-07 ENCOUNTER — Ambulatory Visit: Admitting: Surgical

## 2024-05-07 DIAGNOSIS — S92151A Displaced avulsion fracture (chip fracture) of right talus, initial encounter for closed fracture: Secondary | ICD-10-CM

## 2024-05-07 DIAGNOSIS — M25511 Pain in right shoulder: Secondary | ICD-10-CM

## 2024-05-07 DIAGNOSIS — G8929 Other chronic pain: Secondary | ICD-10-CM

## 2024-05-14 ENCOUNTER — Telehealth: Payer: Self-pay | Admitting: Orthopaedic Surgery

## 2024-05-14 NOTE — Telephone Encounter (Signed)
 The patient lvm requesting an itemized bill.  Tried to call the pt back, unable to lvm.  She needs to call billing [phone].

## 2024-05-18 NOTE — Progress Notes (Signed)
 Post-fracture Visit Note   Patient: Tracy Curtis           Date of Birth: 07-24-1941           MRN: [id number] Visit Date: 05/07/2024 PCP: Lari Elspeth BRAVO, MD   Assessment & Plan:  Chief Complaint:  Chief Complaint  Patient presents with   right ankle fracture follow up   bilateral shoulder pain   Visit Diagnoses:  1. Closed displaced avulsion fracture of right talus, initial encounter   2. Chronic right shoulder pain     Plan: Patient returns for reevaluation of right talar avulsion fracture after fall sustained on 03/11/2024.  She states that her ankle/foot is feeling great.  She has been using ASO but has come out of the ASO today to see how she would feel walking into her appointment today.  She describes really no pain or discomfort.  No stiffness in the ankle.  Ankle does not give out on her.  Minimal swelling at this point.  On exam, patient has intact ankle dorsiflexion, plantarflexion, inversion, eversion.  Minimal tenderness over the fracture site.  Palpable PT pulse.  There is no significant swelling noted.  No ecchymosis.  No plantar ecchymosis.  Stable to anterior drawer the ankle and stressing the syndesmosis.  Plan at this time is return to regular shoe and activity as tolerated for her right foot.  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  Not bothering her enough for any sort of injection but she would like to try physical therapy to design home exercise program to optimize her shoulder function long-term.  This was set up for her and she will follow-up with the office as needed.  Follow-Up Instructions: No follow-ups on file.   Orders:  Orders Placed This Encounter  Procedures   DG Ankle Complete Right   Ambulatory referral to  Physical Therapy   No orders of the defined types were placed in this encounter.   Imaging: No results found.  PMFS History: Patient Active Problem List   Diagnosis Date Noted   Cardiac chest pain 10/17/2023   ERRONEOUS ENCOUNTER--DISREGARD 04/23/2023   HTN (hypertension) 04/12/2023   HLD (hyperlipidemia) 04/12/2023   SOB (shortness of breath) 04/12/2023   CLL (chronic lymphocytic leukemia) (HCC) 11/30/2021   Leukocytosis 11/09/2021   Past Medical History:  Diagnosis Date   BMI 31.0-31.9,adult    Chest tightness    CLL (chronic lymphocytic leukemia) (HCC)    Ear pain    HTN (hypertension)    Hyperlipidemia    Hypothyroid    Otalgia    SOB (shortness of breath)    Squamous cell carcinoma of skin 04/15/2024   In situ - Left malar cheek - needs 5FU vs LN2    Family History  Problem Relation Age of Onset   Stroke Mother    Diabetes Mother    Heart attack Father    Diabetes Father     No past surgical history on file. Social History   Occupational History   Not on file  Tobacco Use   Smoking status: Never   Smokeless tobacco: Never  Vaping Use   Vaping status: Never Used  Substance and Sexual Activity   Alcohol use: Never   Drug use: Never   Sexual activity: Not  on file

## 2024-06-11 ENCOUNTER — Other Ambulatory Visit

## 2024-07-14 ENCOUNTER — Encounter: Payer: Self-pay | Admitting: Radiology

## 2024-07-23 ENCOUNTER — Inpatient Hospital Stay

## 2024-07-23 ENCOUNTER — Inpatient Hospital Stay: Admitting: Oncology

## 2024-07-24 ENCOUNTER — Other Ambulatory Visit: Payer: Self-pay | Admitting: Physician Assistant

## 2024-08-05 ENCOUNTER — Ambulatory Visit: Admitting: Physician Assistant

## 2024-08-18 ENCOUNTER — Ambulatory Visit: Admitting: Physician Assistant

## 2024-09-08 ENCOUNTER — Encounter: Payer: Self-pay | Admitting: *Deleted
